# Patient Record
Sex: Female | Born: 1950
Health system: Southern US, Community
[De-identification: ages and names within clinical notes are randomized; demographics above are authoritative.]

## PROBLEM LIST (undated history)

## (undated) DIAGNOSIS — I10 Essential (primary) hypertension: Secondary | ICD-10-CM

## (undated) DIAGNOSIS — R87619 Unspecified abnormal cytological findings in specimens from cervix uteri: Secondary | ICD-10-CM

## (undated) DIAGNOSIS — K219 Gastro-esophageal reflux disease without esophagitis: Secondary | ICD-10-CM

## (undated) DIAGNOSIS — N6009 Solitary cyst of unspecified breast: Secondary | ICD-10-CM

## (undated) HISTORY — DX: Unspecified abnormal cytological findings in specimens from cervix uteri: R87.619

## (undated) HISTORY — PX: ABDOMINAL HYSTERECTOMY: SHX81

## (undated) HISTORY — DX: Solitary cyst of unspecified breast: N60.09

## (undated) HISTORY — PX: BREAST BIOPSY: SHX20

## (undated) HISTORY — PX: OOPHORECTOMY: SHX86

## (undated) HISTORY — PX: GANGLION CYST EXCISION: SHX1691

---

## 2004-10-15 ENCOUNTER — Ambulatory Visit: Payer: Self-pay | Admitting: Internal Medicine

## 2004-12-23 ENCOUNTER — Ambulatory Visit: Payer: Self-pay

## 2005-01-11 ENCOUNTER — Ambulatory Visit: Payer: Self-pay | Admitting: Unknown Physician Specialty

## 2006-01-04 ENCOUNTER — Ambulatory Visit: Payer: Self-pay

## 2007-01-12 ENCOUNTER — Ambulatory Visit: Payer: Self-pay

## 2008-01-22 ENCOUNTER — Ambulatory Visit: Payer: Self-pay

## 2009-01-22 ENCOUNTER — Ambulatory Visit: Payer: Self-pay

## 2009-02-02 ENCOUNTER — Ambulatory Visit: Payer: Self-pay

## 2009-07-22 ENCOUNTER — Ambulatory Visit: Payer: Self-pay

## 2010-01-28 ENCOUNTER — Ambulatory Visit: Payer: Self-pay

## 2010-03-01 ENCOUNTER — Ambulatory Visit: Payer: Self-pay | Admitting: Unknown Physician Specialty

## 2010-03-03 LAB — PATHOLOGY REPORT

## 2011-01-31 ENCOUNTER — Ambulatory Visit: Payer: Self-pay

## 2012-02-01 ENCOUNTER — Ambulatory Visit: Payer: Self-pay

## 2012-03-07 ENCOUNTER — Emergency Department: Payer: Self-pay

## 2012-03-07 LAB — TROPONIN I
Troponin-I: <0.02 ng/mL
Troponin-I: <0.02 ng/mL

## 2012-03-07 LAB — BASIC METABOLIC PANEL
Anion Gap: 9 (ref 7–16)
Chloride: 105 mmol/L (ref 98–107)
Co2: 26 mmol/L (ref 21–32)
Creatinine: 0.99 mg/dL (ref 0.60–1.30)
EGFR (African American): >60
EGFR (Non-African Amer.): >60
Glucose: 100 mg/dL — ABNORMAL HIGH (ref 65–99)
Osmolality: 282 (ref 275–301)
Potassium: 3.5 mmol/L (ref 3.5–5.1)
Sodium: 140 mmol/L (ref 136–145)

## 2012-03-07 LAB — CK TOTAL AND CKMB (NOT AT ARMC): CK, Total: 79 U/L (ref 21–215)

## 2012-03-07 LAB — CBC
HCT: 41.8 % (ref 35.0–47.0)
MCHC: 33.8 g/dL (ref 32.0–36.0)
MCV: 86 fL (ref 80–100)
RDW: 13.6 % (ref 11.5–14.5)
WBC: 10.5 10*3/uL (ref 3.6–11.0)

## 2013-02-06 ENCOUNTER — Ambulatory Visit: Payer: Self-pay | Admitting: General Practice

## 2014-02-11 ENCOUNTER — Ambulatory Visit: Payer: Self-pay

## 2014-07-31 ENCOUNTER — Encounter: Payer: Self-pay | Admitting: *Deleted

## 2014-07-31 ENCOUNTER — Ambulatory Visit: Payer: 59 | Admitting: Anesthesiology

## 2014-07-31 ENCOUNTER — Encounter
Admission: RE | Disposition: A | Discharge status: Home / Self Care | Payer: Self-pay | Source: Ambulatory Visit | Attending: Unknown Physician Specialty

## 2014-07-31 ENCOUNTER — Ambulatory Visit
Admission: RE | Admit: 2014-07-31 | Discharge: 2014-07-31 | Disposition: A | Discharge status: Home / Self Care | Payer: 59 | Source: Ambulatory Visit | Attending: Unknown Physician Specialty | Admitting: Unknown Physician Specialty

## 2014-07-31 DIAGNOSIS — I10 Essential (primary) hypertension: Secondary | ICD-10-CM | POA: Insufficient documentation

## 2014-07-31 DIAGNOSIS — K5730 Diverticulosis of large intestine without perforation or abscess without bleeding: Secondary | ICD-10-CM | POA: Insufficient documentation

## 2014-07-31 DIAGNOSIS — D123 Benign neoplasm of transverse colon: Secondary | ICD-10-CM | POA: Insufficient documentation

## 2014-07-31 DIAGNOSIS — Z79899 Other long term (current) drug therapy: Secondary | ICD-10-CM | POA: Diagnosis not present

## 2014-07-31 DIAGNOSIS — Z86010 Personal history of colonic polyps: Secondary | ICD-10-CM | POA: Insufficient documentation

## 2014-07-31 DIAGNOSIS — K219 Gastro-esophageal reflux disease without esophagitis: Secondary | ICD-10-CM | POA: Diagnosis not present

## 2014-07-31 DIAGNOSIS — K449 Diaphragmatic hernia without obstruction or gangrene: Secondary | ICD-10-CM | POA: Insufficient documentation

## 2014-07-31 DIAGNOSIS — K2950 Unspecified chronic gastritis without bleeding: Secondary | ICD-10-CM | POA: Diagnosis not present

## 2014-07-31 DIAGNOSIS — K621 Rectal polyp: Secondary | ICD-10-CM | POA: Insufficient documentation

## 2014-07-31 DIAGNOSIS — Z1211 Encounter for screening for malignant neoplasm of colon: Secondary | ICD-10-CM | POA: Diagnosis not present

## 2014-07-31 DIAGNOSIS — K648 Other hemorrhoids: Secondary | ICD-10-CM | POA: Diagnosis not present

## 2014-07-31 DIAGNOSIS — R12 Heartburn: Secondary | ICD-10-CM | POA: Diagnosis present

## 2014-07-31 HISTORY — DX: Essential (primary) hypertension: I10

## 2014-07-31 HISTORY — DX: Gastro-esophageal reflux disease without esophagitis: K21.9

## 2014-07-31 HISTORY — PX: ESOPHAGOGASTRODUODENOSCOPY: SHX5428

## 2014-07-31 HISTORY — PX: COLONOSCOPY WITH PROPOFOL: SHX5780

## 2014-07-31 SURGERY — COLONOSCOPY WITH PROPOFOL
Anesthesia: General

## 2014-07-31 MED ORDER — PROPOFOL 10 MG/ML IV BOLUS
INTRAVENOUS | Status: DC | PRN
  Administered 2014-07-31: 75 mg via INTRAVENOUS

## 2014-07-31 MED ORDER — PROPOFOL INFUSION 10 MG/ML OPTIME
INTRAVENOUS | Status: DC | PRN
  Administered 2014-07-31: 75 ug/kg/min via INTRAVENOUS

## 2014-07-31 MED ORDER — PHENYLEPHRINE HCL 10 MG/ML IJ SOLN
INTRAMUSCULAR | Status: DC | PRN
  Administered 2014-07-31 (×2): 50 ug via INTRAVENOUS

## 2014-07-31 MED ORDER — SODIUM CHLORIDE 0.9 % IV BOLUS (SEPSIS)
1000.0000 mL | Freq: Once | INTRAVENOUS | Status: AC
  Administered 2014-07-31: 1000 mL via INTRAVENOUS

## 2014-07-31 MED ORDER — SODIUM CHLORIDE 0.9 % IJ SOLN: 1000.0000 mL | Freq: Once | INTRAMUSCULAR | Status: DC

## 2014-07-31 MED ORDER — LACTATED RINGERS IV SOLN
INTRAVENOUS | Status: DC | PRN
  Administered 2014-07-31: 13:00:00 via INTRAVENOUS

## 2014-07-31 NOTE — Anesthesia Postprocedure Evaluation (Signed)
  Anesthesia Post-op Note  Patient: Emily Olson  Procedure(s) Performed: Procedure(s): COLONOSCOPY WITH PROPOFOL (N/A) ESOPHAGOGASTRODUODENOSCOPY (EGD) (N/A)  Anesthesia type:General  Patient location: PACU  Post pain: Pain level controlled  Post assessment: Post-op Vital signs reviewed, Patient's Cardiovascular Status Stable, Respiratory Function Stable, Patent Airway and No signs of Nausea or vomiting  Post vital signs: Reviewed and stable  Last Vitals:  Filed Vitals:   07/31/14 1400  BP: 140/76  Pulse: 64  Temp:   Resp: 13    Level of consciousness: awake, alert  and patient cooperative  Complications: No apparent anesthesia complications

## 2014-07-31 NOTE — Anesthesia Preprocedure Evaluation (Signed)
Anesthesia Evaluation  Patient identified by MRN, date of birth, ID band Patient awake    Reviewed: Allergy & Precautions, NPO status , Patient's Chart, lab work & pertinent test results  Airway Mallampati: II  TM Distance: >3 FB     Dental  (+) Chipped   Pulmonary          Cardiovascular hypertension, Pt. on medications     Neuro/Psych    GI/Hepatic   Endo/Other    Renal/GU      Musculoskeletal   Abdominal   Peds  Hematology   Anesthesia Other Findings   Reproductive/Obstetrics                             Anesthesia Physical Anesthesia Plan  ASA: II  Anesthesia Plan: General   Post-op Pain Management:    Induction: Intravenous  Airway Management Planned: Nasal Cannula  Additional Equipment:   Intra-op Plan:   Post-operative Plan:   Informed Consent: I have reviewed the patients History and Physical, chart, labs and discussed the procedure including the risks, benefits and alternatives for the proposed anesthesia with the patient or authorized representative who has indicated his/her understanding and acceptance.     Plan Discussed with: CRNA  Anesthesia Plan Comments:         Anesthesia Quick Evaluation

## 2014-07-31 NOTE — Transfer of Care (Signed)
Immediate Anesthesia Transfer of Care Note  Patient: Emily Olson  Procedure(s) Performed: Procedure(s): COLONOSCOPY WITH PROPOFOL (N/A) ESOPHAGOGASTRODUODENOSCOPY (EGD) (N/A)  Patient Location: PACU and Endoscopy Unit  Anesthesia Type:General  Level of Consciousness: awake, alert  and oriented  Airway & Oxygen Therapy: Patient Spontanous Breathing and Patient connected to nasal cannula oxygen  Post-op Assessment: Report given to RN and Post -op Vital signs reviewed and stable  Post vital signs: Reviewed and stable  Last Vitals:  Filed Vitals:   07/31/14 1104  BP: 144/67  Pulse: 63  Temp: 35.2 C  Resp: 16    Complications: No apparent anesthesia complications

## 2014-07-31 NOTE — Op Note (Signed)
[facility] Gastroenterology Patient Name: Emily Olson Procedure Date: 07/31/2014 12:44 PM MRN: [id number] Account #: [id number] Date of Birth: December 22, 1950 Admit Type: Inpatient Age: 64 Room: [facility] ENDO ROOM 1 Gender: Female Note Status: Finalized Procedure:         Colonoscopy Indications:       Personal history of colonic polyps Providers:         Manya Silvas, MD Medicines:         Propofol per Anesthesia Complications:     No immediate complications. Procedure:         Pre-Anesthesia Assessment:                    - After reviewing the risks and benefits, the patient was                     deemed in satisfactory condition to undergo the procedure.                    After obtaining informed consent, the colonoscope was                     passed under direct vision. Throughout the procedure, the                     patient's blood pressure, pulse, and oxygen saturations                     were monitored continuously. The Colonoscope was                     introduced through the anus and advanced to the the cecum,                     identified by appendiceal orifice and ileocecal valve. The                     colonoscopy was performed without difficulty. The patient                     tolerated the procedure well. The quality of the bowel                     preparation was good. Findings:      Two sessile polyps were found in the rectum and at the hepatic flexure.       The polyps were diminutive in size. These polyps were removed with a       jumbo cold forceps. Resection and retrieval were complete.      A few medium-mouthed diverticula were found in the sigmoid colon.      Internal hemorrhoids were found during endoscopy. The hemorrhoids were       small and Grade I (internal hemorrhoids that do not prolapse). Impression:        - Two diminutive polyps in the rectum and at the hepatic                     flexure. Resected and retrieved.              - Diverticulosis in the sigmoid colon.                    - Internal hemorrhoids. Recommendation:    - Await pathology results. Manya Silvas, MD 07/31/2014 1:26:08 PM  This report has been signed electronically. Number of Addenda: 0 Note Initiated On: 07/31/2014 12:44 PM Scope Withdrawal Time: 0 hours 9 minutes 6 seconds  Total Procedure Duration: 0 hours 18 minutes 28 seconds       [facility]

## 2014-07-31 NOTE — Op Note (Signed)
[facility] Gastroenterology Patient Name: Emily Olson Procedure Date: 07/31/2014 12:43 PM MRN: [id number] Account #: [id number] Date of Birth: 02-18-1950 Admit Type: Outpatient Age: 64 Room: [facility] ENDO ROOM 1 Gender: Female Note Status: Finalized Procedure:         Upper GI endoscopy Indications:       Heartburn, Suspected gastro-esophageal reflux disease Providers:         Manya Silvas, MD Referring MD:      Mechele Collin. Anderson (Referring MD) Medicines:         Propofol per Anesthesia Complications:     No immediate complications. Procedure:         Pre-Anesthesia Assessment:                    - After reviewing the risks and benefits, the patient was                     deemed in satisfactory condition to undergo the procedure.                    After obtaining informed consent, the endoscope was passed                     under direct vision. Throughout the procedure, the                     patient's blood pressure, pulse, and oxygen saturations                     were monitored continuously. The Olympus GIF-160 endoscope                     (S#. [id number]) was introduced through the mouth, and                     advanced to the second part of duodenum. The upper GI                     endoscopy was accomplished without difficulty. The patient                     tolerated the procedure well. Findings:      The examined esophagus was normal. GEJ 39-40cm      A small hiatus hernia was present.      Diffuse mild inflammation characterized by erythema and granularity was       found in the gastric body. Biopsies were taken with a cold forceps for       histology. Biopsies were taken with a cold forceps for Helicobacter       pylori testing.      The examined duodenum was normal. Impression:        - Normal esophagus.                    - Small hiatus hernia.                    - Gastritis. Biopsied.                    - Normal examined  duodenum. Recommendation:    - Await pathology results. Manya Silvas, MD 07/31/2014 1:04:11 PM This report has been signed electronically. Number of Addenda: 0 Note Initiated On: 07/31/2014 12:43 PM      [place]  [facility]

## 2014-07-31 NOTE — H&P (Signed)
Primary Care Physician:  Kirk Ruths., MD Primary Gastroenterologist:  Dr. Vira Agar  Pre-Procedure History & Physical: HPI:  Emily Olson is a 64 y.o. female is here for an endoscopy and colonoscopy.   Past Medical History  Diagnosis Date  . Hypertension   . GERD (gastroesophageal reflux disease)     Past Surgical History  Procedure Laterality Date  . Ganglion cyst excision Right     Prior to Admission medications   Medication Sig Start Date End Date Taking? Authorizing Provider  amLODipine (NORVASC) 10 MG tablet Take 10 mg by mouth daily.   Yes Historical Provider, MD  hydrochlorothiazide (HYDRODIURIL) 25 MG tablet Take 25 mg by mouth daily.   Yes Historical Provider, MD  pantoprazole (PROTONIX) 40 MG tablet Take 40 mg by mouth daily.   Yes Historical Provider, MD  potassium chloride (K-DUR) 10 MEQ tablet Take 10 mEq by mouth daily.   Yes Historical Provider, MD    Allergies as of 07/29/2014  . (Not on File)    History reviewed. No pertinent family history.  History   Social History  . Marital Status: Married    Spouse Name: N/A  . Number of Children: N/A  . Years of Education: N/A   Occupational History  . Not on file.   Social History Main Topics  . Smoking status: Never Smoker   . Smokeless tobacco: Never Used  . Alcohol Use: No  . Drug Use: No  . Sexual Activity: Not on file   Other Topics Concern  . Not on file   Social History Narrative    Review of Systems: See HPI, otherwise negative ROS  Physical Exam: BP 144/67 mmHg  Pulse 63  Temp(Src) 95.3 F (35.2 C) (Tympanic)  Resp 16  Ht 5\' 8"  (1.727 m)  Wt 101.606 kg (224 lb)  BMI 34.07 kg/m2  SpO2 100% General:   Alert,  pleasant and cooperative in NAD Head:  Normocephalic and atraumatic. Neck:  Supple; no masses or thyromegaly. Lungs:  Clear throughout to auscultation.    Heart:  Regular rate and rhythm. Abdomen:  Soft, nontender and nondistended. Normal bowel sounds,  without guarding, and without rebound.   Neurologic:  Alert and  oriented x4;  grossly normal neurologically. endoscopy and colonoscopy Impression/Plan: Emily Olson is here for an  to be performed for personal history of colon polyps and epigastric abdominal pain  Risks, benefits, limitations, and alternatives regarding  endoscopy and colonoscopy have been reviewed with the patient.  Questions have been answered.  All parties agreeable.   Gaylyn Cheers, MD  07/31/2014, 12:49 PM   Primary Care Physician:  Kirk Ruths., MD Primary Gastroenterologist:  Dr. Vira Agar  Pre-Procedure History & Physical: HPI:  Emily Olson is a 64 y.o. female is here for an endoscopy and colonoscopy.   Past Medical History  Diagnosis Date  . Hypertension   . GERD (gastroesophageal reflux disease)     Past Surgical History  Procedure Laterality Date  . Ganglion cyst excision Right     Prior to Admission medications   Medication Sig Start Date End Date Taking? Authorizing Provider  amLODipine (NORVASC) 10 MG tablet Take 10 mg by mouth daily.   Yes Historical Provider, MD  hydrochlorothiazide (HYDRODIURIL) 25 MG tablet Take 25 mg by mouth daily.   Yes Historical Provider, MD  pantoprazole (PROTONIX) 40 MG tablet Take 40 mg by mouth daily.   Yes Historical Provider, MD  potassium chloride (K-DUR) 10 MEQ tablet Take 10 mEq  by mouth daily.   Yes Historical Provider, MD    Allergies as of 07/29/2014  . (Not on File)    History reviewed. No pertinent family history.  History   Social History  . Marital Status: Married    Spouse Name: N/A  . Number of Children: N/A  . Years of Education: N/A   Occupational History  . Not on file.   Social History Main Topics  . Smoking status: Never Smoker   . Smokeless tobacco: Never Used  . Alcohol Use: No  . Drug Use: No  . Sexual Activity: Not on file   Other Topics Concern  . Not on file   Social History Narrative    Review  of Systems: See HPI, otherwise negative ROS  Physical Exam: BP 144/67 mmHg  Pulse 63  Temp(Src) 95.3 F (35.2 C) (Tympanic)  Resp 16  Ht 5\' 8"  (1.727 m)  Wt 101.606 kg (224 lb)  BMI 34.07 kg/m2  SpO2 100% General:   Alert,  pleasant and cooperative in NAD Head:  Normocephalic and atraumatic. Neck:  Supple; no masses or thyromegaly. Lungs:  Clear throughout to auscultation.    Heart:  Regular rate and rhythm. Abdomen:  Soft, nontender and nondistended. Normal bowel sounds, without guarding, and without rebound.   Neurologic:  Alert and  oriented x4;  grossly normal neurologically.  Impression/Plan: Emily Olson is here for an endoscopy and colonoscopy to be performed for personal history of colon polyps and epigastric abdominal pain.  Risks, benefits, limitations, and alternatives regarding  endoscopy and colonoscopy have been reviewed with the patient.  Questions have been answered.  All parties agreeable.   Gaylyn Cheers, MD  07/31/2014, 12:49 PM

## 2014-08-04 LAB — SURGICAL PATHOLOGY

## 2014-08-11 ENCOUNTER — Encounter: Payer: Self-pay | Admitting: Unknown Physician Specialty

## 2014-09-05 IMAGING — MG MM CAD SCREENING MAMMO
1 series · 4 of 4 positions shown · non-contrast
Comparison: none

REASON FOR EXAM: SCR MAMMO CAT 2 NO ORDER
COMMENTS:

[R CC · right · 4 of 4 slices shown]
[im 1/4]
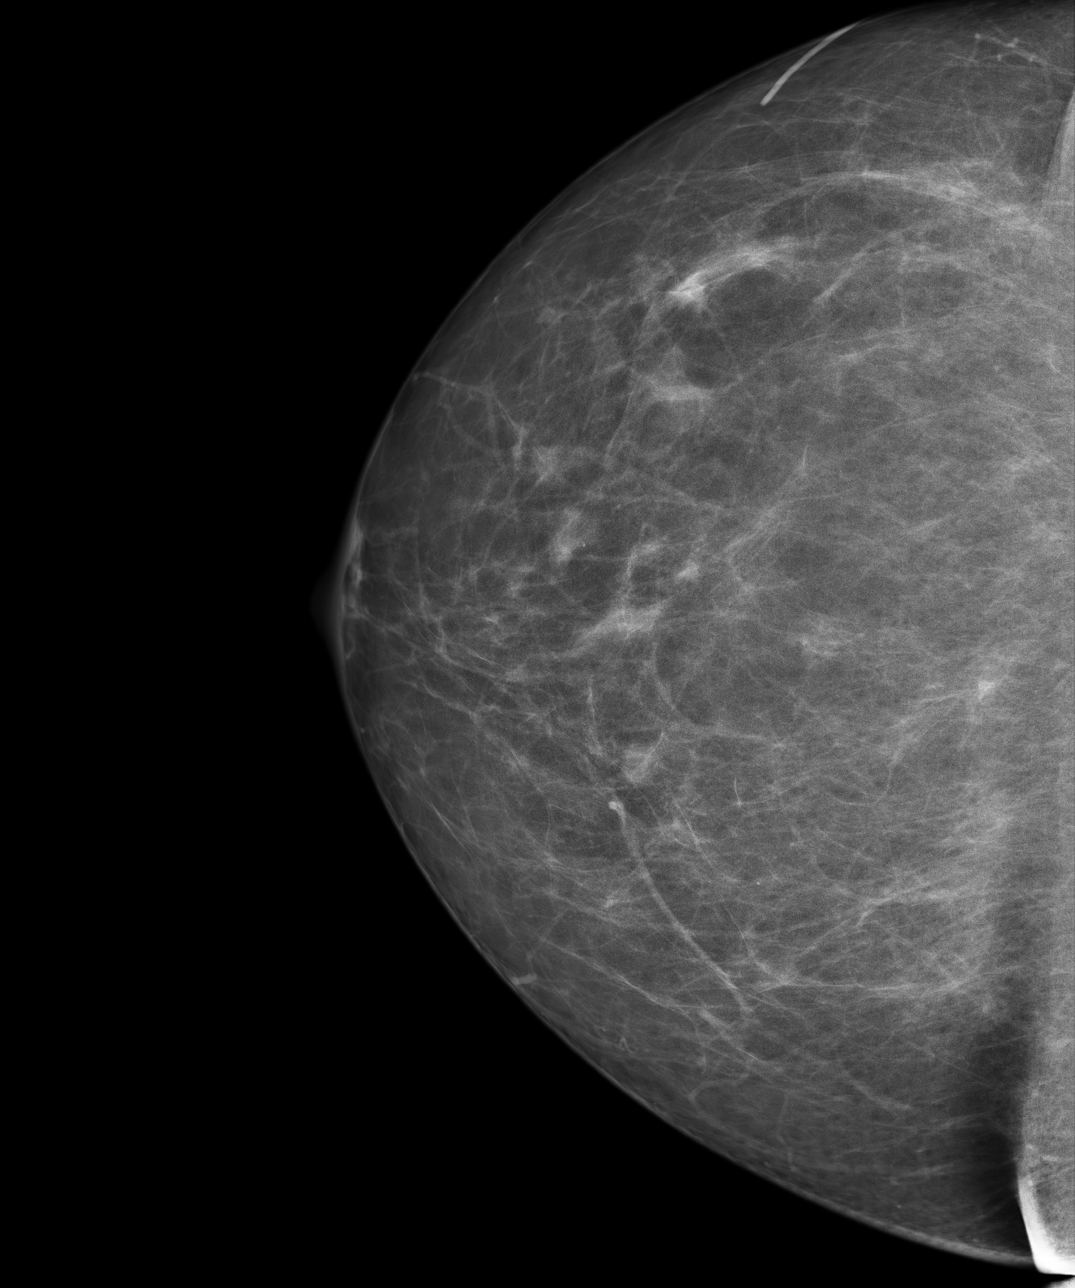
[im 2/4]
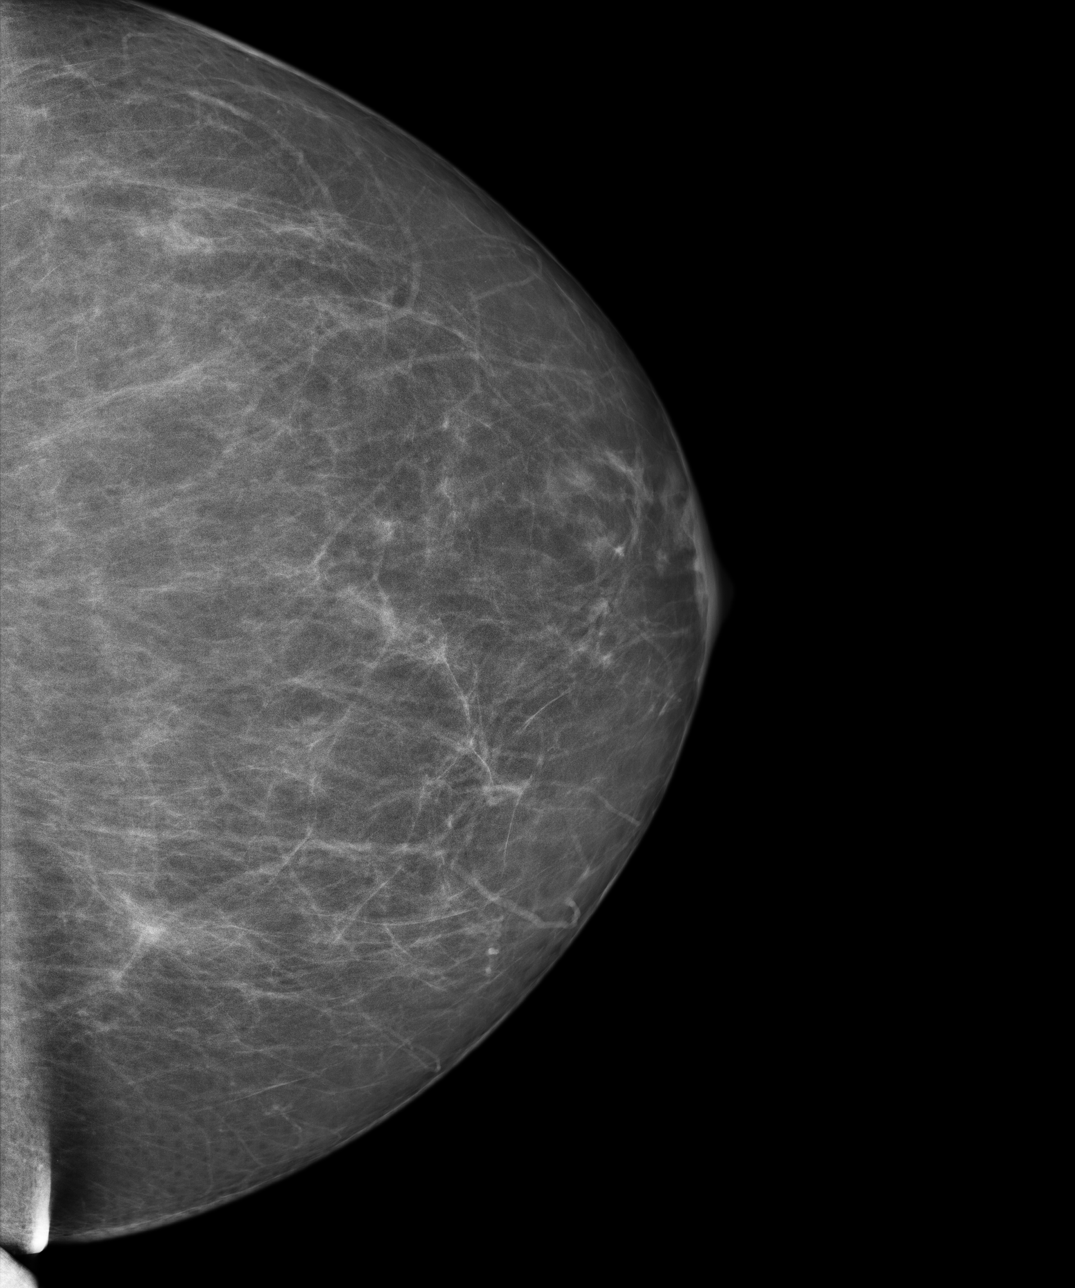
[im 3/4]
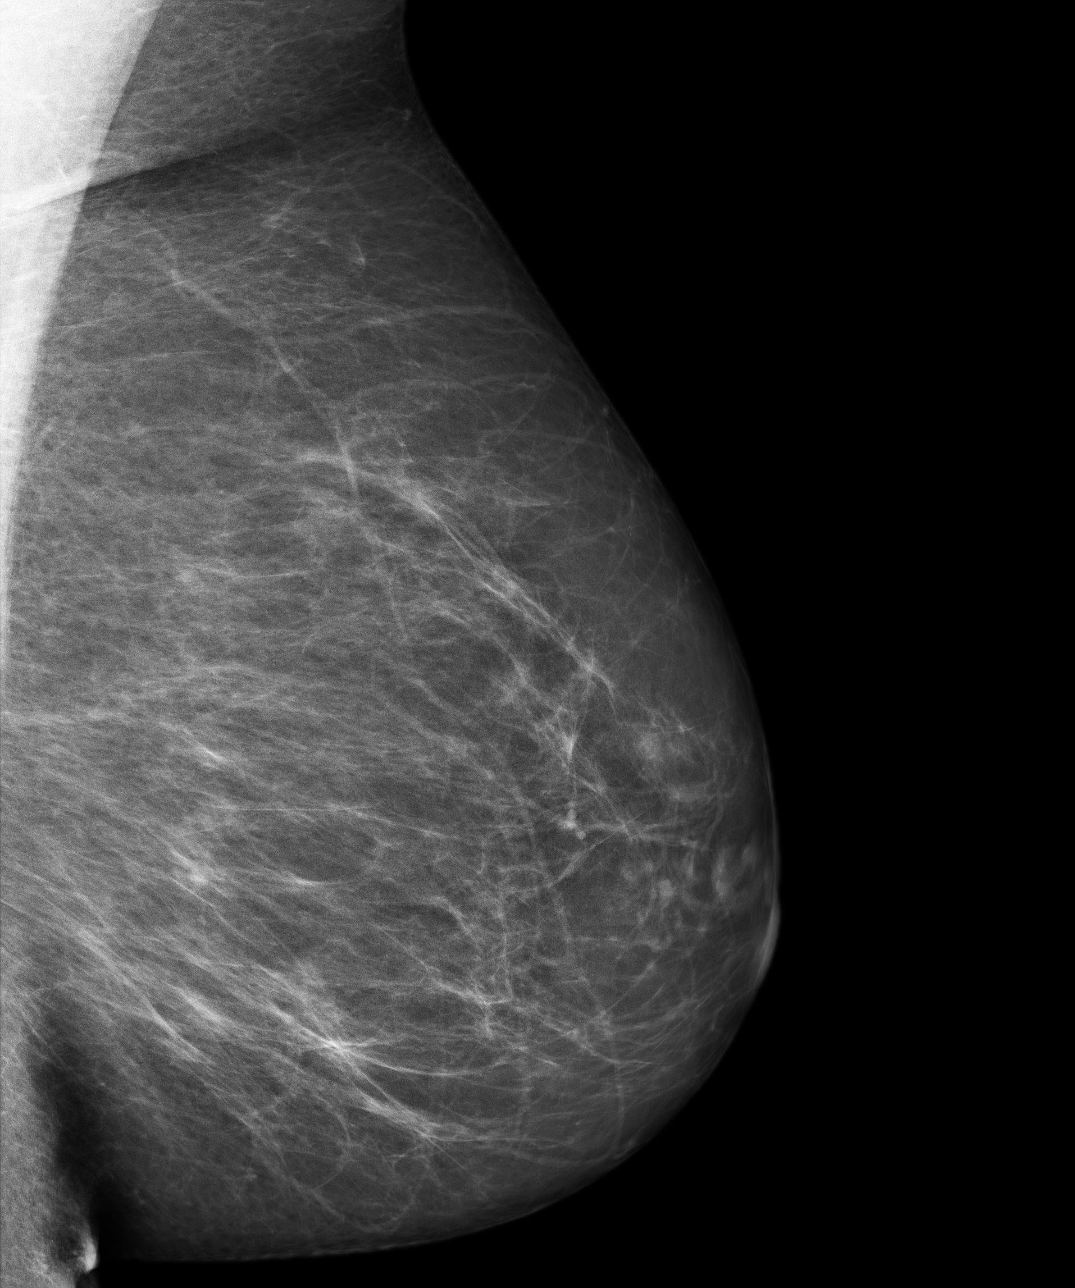
[im 4/4]
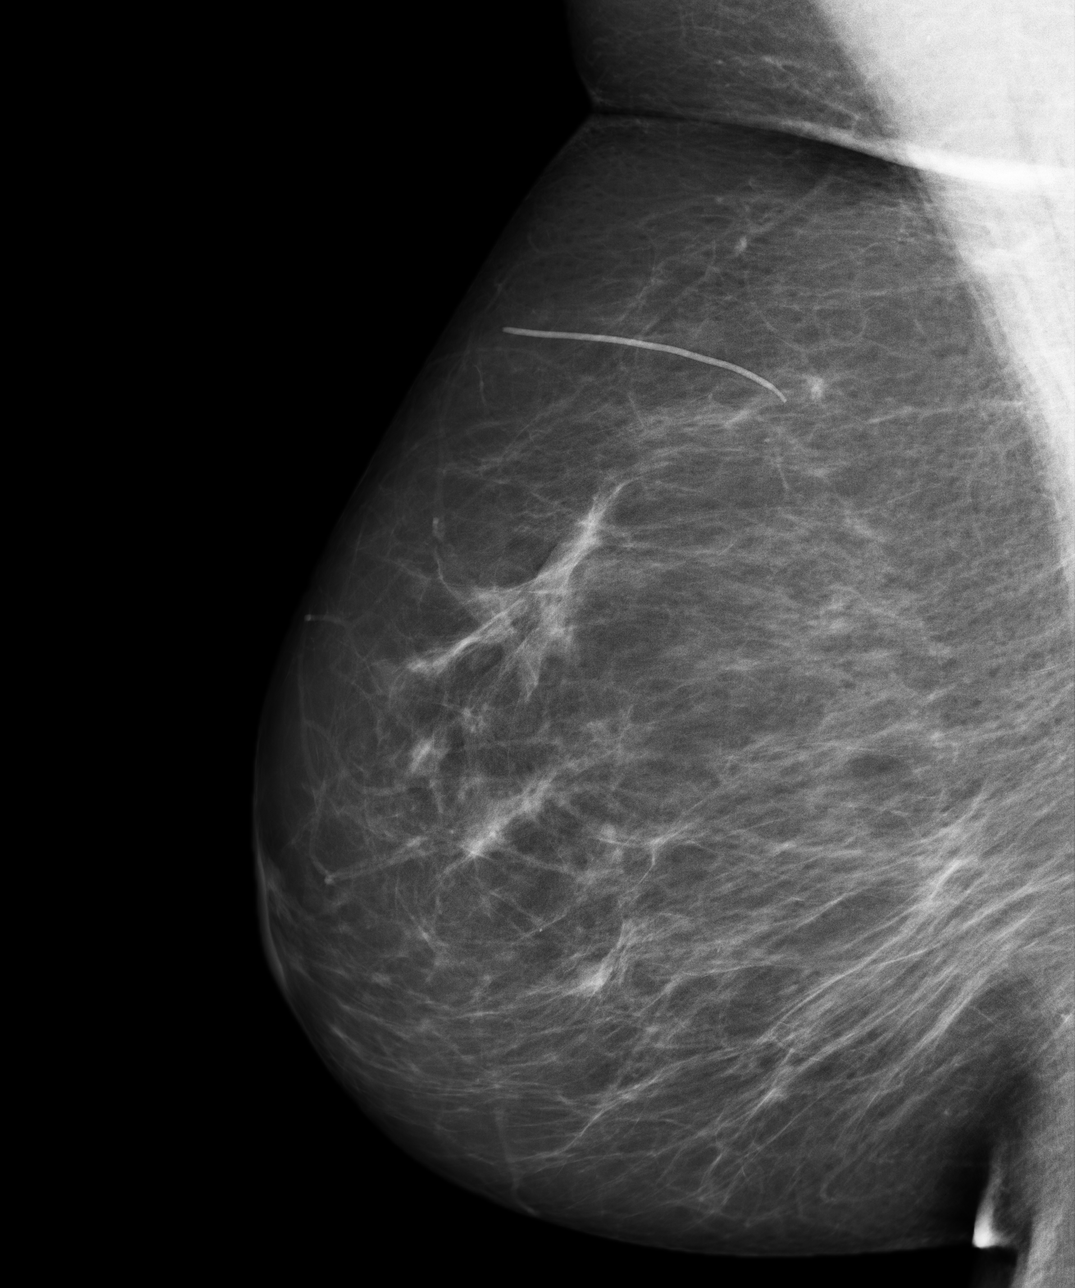

[4 of 4 positions shown; findings below may reference images not displayed]

PROCEDURE:     MAM - MAM DGTL SCRN MAM NO ORDER W/CAD  - February 01, 2012  [DATE]

RESULT:     Comparison is made to previous digital studies dated [DATE]
[DATE], [DATE], [DATE] [DATE], [DATE], [DATE] [DATE], [DATE], and January 04, 2006.

The breasts exhibit a scattered moderately dense parenchymal pattern. A
surgical scar marker has been placed superolaterally on the right. There are
no malignant appearing groupings of microcalcification. There is no evidence
of new architectural distortion.
IMPRESSION: There are no findings suspicious for malignancy.

BI-RADS 2: Benign findings.

Recommendation: Please continue to encourage yearly mammographic followup.

A NEGATIVE MAMMOGRAM REPORT DOES NOT PRECLUDE BIOPSY OR OTHER EVALUATION OF
A CLINICALLY PALPABLE OR OTHERWISE SUSPICIOUS MASS OR LESION. BREAST CANCER
MAY NOT BE DETECTED BY MAMMOGRAPHY IN UP TO 10% OF CASES.

[REDACTED]

## 2015-01-13 ENCOUNTER — Other Ambulatory Visit: Payer: Self-pay | Admitting: Unknown Physician Specialty

## 2015-01-13 DIAGNOSIS — Z1231 Encounter for screening mammogram for malignant neoplasm of breast: Secondary | ICD-10-CM

## 2015-01-31 LAB — HM PAP SMEAR: HM PAP: NEGATIVE

## 2015-02-13 ENCOUNTER — Ambulatory Visit
Admission: RE | Admit: 2015-02-13 | Discharge: 2015-02-13 | Disposition: A | Discharge status: Home / Self Care | Payer: No Typology Code available for payment source | Source: Ambulatory Visit | Attending: Unknown Physician Specialty | Admitting: Unknown Physician Specialty

## 2015-02-13 ENCOUNTER — Other Ambulatory Visit: Payer: Self-pay | Admitting: Unknown Physician Specialty

## 2015-02-13 DIAGNOSIS — Z1231 Encounter for screening mammogram for malignant neoplasm of breast: Secondary | ICD-10-CM

## 2016-02-01 ENCOUNTER — Other Ambulatory Visit: Payer: Self-pay | Admitting: Obstetrics & Gynecology

## 2016-02-01 DIAGNOSIS — Z1231 Encounter for screening mammogram for malignant neoplasm of breast: Secondary | ICD-10-CM

## 2016-02-18 ENCOUNTER — Ambulatory Visit
Admission: RE | Admit: 2016-02-18 | Discharge: 2016-02-18 | Disposition: A | Discharge status: Home / Self Care | Payer: BLUE CROSS/BLUE SHIELD | Source: Ambulatory Visit | Attending: Obstetrics & Gynecology | Admitting: Obstetrics & Gynecology

## 2016-02-18 DIAGNOSIS — Z1231 Encounter for screening mammogram for malignant neoplasm of breast: Secondary | ICD-10-CM | POA: Diagnosis present

## 2016-02-18 LAB — HM MAMMOGRAPHY

## 2016-11-22 ENCOUNTER — Other Ambulatory Visit: Payer: Self-pay | Admitting: Obstetrics & Gynecology

## 2016-11-22 DIAGNOSIS — Z1231 Encounter for screening mammogram for malignant neoplasm of breast: Secondary | ICD-10-CM

## 2017-01-26 ENCOUNTER — Encounter: Payer: Self-pay | Admitting: Obstetrics & Gynecology

## 2017-02-02 ENCOUNTER — Encounter: Payer: Self-pay | Admitting: Obstetrics & Gynecology

## 2017-02-02 ENCOUNTER — Telehealth: Payer: Self-pay | Admitting: Obstetrics & Gynecology

## 2017-02-02 ENCOUNTER — Ambulatory Visit (INDEPENDENT_AMBULATORY_CARE_PROVIDER_SITE_OTHER): Payer: Medicare Other | Admitting: Obstetrics & Gynecology

## 2017-02-02 VITALS — BP 120/80 | HR 64 | Ht 68.5 in | Wt 235.0 lb

## 2017-02-02 DIAGNOSIS — Z13820 Encounter for screening for osteoporosis: Secondary | ICD-10-CM

## 2017-02-02 DIAGNOSIS — Z1231 Encounter for screening mammogram for malignant neoplasm of breast: Secondary | ICD-10-CM | POA: Diagnosis not present

## 2017-02-02 DIAGNOSIS — Z1211 Encounter for screening for malignant neoplasm of colon: Secondary | ICD-10-CM

## 2017-02-02 DIAGNOSIS — Z1239 Encounter for other screening for malignant neoplasm of breast: Secondary | ICD-10-CM

## 2017-02-02 DIAGNOSIS — Z01419 Encounter for gynecological examination (general) (routine) without abnormal findings: Secondary | ICD-10-CM | POA: Diagnosis not present

## 2017-02-02 DIAGNOSIS — Z0000 Encounter for general adult medical examination without abnormal findings: Secondary | ICD-10-CM

## 2017-02-02 NOTE — Progress Notes (Signed)
HPI:      Emily Olson is a 66 y.o. G2P1011 who LMP was in the past, she presents today for her annual examination.  The patient has no complaints today. The patient is sexually active. Herlast pap: approximate date 2016 and was normal and last mammogram: approximate date 2017 and was normal.  The patient does perform self breast exams.  There is no notable family history of breast or ovarian cancer in her family. The patient is not taking hormone replacement therapy. Patient denies post-menopausal vaginal bleeding.   The patient has regular exercise: yes. The patient denies current symptoms of depression.    GYN Hx: Last Colonoscopy:2 years ago. Normal.  Last DEXA: several years ago.    PMHx: Past Medical History:  Diagnosis Date  . Abnormal Pap smear of cervix   . GERD (gastroesophageal reflux disease)   . Hypertension   . Solitary cyst of breast    Past Surgical History:  Procedure Laterality Date  . ABDOMINAL HYSTERECTOMY    . BREAST BIOPSY Right    neg  . COLONOSCOPY WITH PROPOFOL N/A 07/31/2014   Procedure: COLONOSCOPY WITH PROPOFOL;  Surgeon: Manya Silvas, MD;  Location: [facility] ENDOSCOPY;  Service: Endoscopy;  Laterality: N/A;  . ESOPHAGOGASTRODUODENOSCOPY N/A 07/31/2014   Procedure: ESOPHAGOGASTRODUODENOSCOPY (EGD);  Surgeon: Manya Silvas, MD;  Location: [facility] ENDOSCOPY;  Service: Endoscopy;  Laterality: N/A;  . GANGLION CYST EXCISION Right    Family History  Problem Relation Age of Onset  . Breast cancer Cousin 108        2nd time age 65-mat cousin  . Hypertension Mother   . Kidney failure Mother   . Diabetes Father   . Hypertension Father   . Heart attack Father    Social History   Tobacco Use  . Smoking status: Never Smoker  . Smokeless tobacco: Never Used  Substance Use Topics  . Alcohol use: No  . Drug use: No    Current Outpatient Medications:  .  amLODipine (NORVASC) 10 MG tablet, Take 10 mg by mouth daily., Disp: , Rfl:  .   hydrochlorothiazide (HYDRODIURIL) 25 MG tablet, Take 25 mg by mouth daily., Disp: , Rfl:  .  pantoprazole (PROTONIX) 40 MG tablet, Take 40 mg by mouth daily., Disp: , Rfl:  .  potassium chloride (K-DUR) 10 MEQ tablet, Take 10 mEq by mouth daily., Disp: , Rfl:  Allergies: Codeine; Ketoconazole; and Penicillins  Review of Systems  Constitutional: Negative for chills, fever and malaise/fatigue.  HENT: Negative for congestion, sinus pain and sore throat.   Eyes: Negative for blurred vision and pain.  Respiratory: Negative for cough and wheezing.   Cardiovascular: Negative for chest pain and leg swelling.  Gastrointestinal: Negative for abdominal pain, constipation, diarrhea, heartburn, nausea and vomiting.  Genitourinary: Negative for dysuria, frequency, hematuria and urgency.  Musculoskeletal: Negative for back pain, joint pain, myalgias and neck pain.  Skin: Negative for itching and rash.  Neurological: Negative for dizziness, tremors and weakness.  Endo/Heme/Allergies: Does not bruise/bleed easily.  Psychiatric/Behavioral: Negative for depression. The patient is not nervous/anxious and does not have insomnia.     Objective: BP 120/80   Pulse 64   Ht 5' 8.5" (1.74 m)   Wt 235 lb (106.6 kg)   BMI 35.21 kg/m   Filed Weights   02/02/17 0804  Weight: 235 lb (106.6 kg)   Body mass index is 35.21 kg/m. Physical Exam  Constitutional: She is oriented to person, place, and time. She appears  well-developed and well-nourished. No distress.  Genitourinary: Rectum normal and vagina normal. Pelvic exam was performed with patient supine. There is no rash or lesion on the right labia. There is no rash or lesion on the left labia. Vagina exhibits no lesion. No bleeding in the vagina. Right adnexum does not display mass and does not display tenderness. Left adnexum does not display mass and does not display tenderness.  Genitourinary Comments: Absent Uterus Absent cervix Vaginal cuff well healed    HENT:  Head: Normocephalic and atraumatic. Head is without laceration.  Right Ear: Hearing normal.  Left Ear: Hearing normal.  Nose: No epistaxis.  No foreign bodies.  Mouth/Throat: Uvula is midline, oropharynx is clear and moist and mucous membranes are normal.  Eyes: Pupils are equal, round, and reactive to light.  Neck: Normal range of motion. Neck supple. No thyromegaly present.  Cardiovascular: Normal rate and regular rhythm. Exam reveals no gallop and no friction rub.  No murmur heard. Pulmonary/Chest: Effort normal and breath sounds normal. No respiratory distress. She has no wheezes. Right breast exhibits no mass, no skin change and no tenderness. Left breast exhibits no mass, no skin change and no tenderness.  Abdominal: Soft. Bowel sounds are normal. She exhibits no distension. There is no tenderness. There is no rebound.  Musculoskeletal: Normal range of motion.  Neurological: She is alert and oriented to person, place, and time. No cranial nerve deficit.  Skin: Skin is warm and dry.  Psychiatric: She has a normal mood and affect. Judgment normal.  Vitals reviewed.  Assessment: Annual Exam 1. Annual physical exam   2. Screen for colon cancer   3. Screening for breast cancer    Plan:            1.  Cervical Screening-  Pap smear schedule reviewed with patient  2. Breast screening- Exam annually and mammogram scheduled  3. Colonoscopy every 10 years, Hemoccult testing after age 35  4. Labs managed by PCP  5. Counseling for hormonal therapy: no change in therapy today (none needed)  6. DEXA for osteoporosis screen    F/U  Return in about 1 year (around 02/02/2018) for Annual.  Barnett Applebaum, MD, [facility], [place] [facility] 02/02/2017  8:12 AM

## 2017-02-02 NOTE — Patient Instructions (Addendum)
PAP every three years Mammogram every year    Call [phone] to schedule at [facility] Colonoscopy every 10 years Labs yearly (with PCP) Bone density scan this year

## 2017-02-02 NOTE — Telephone Encounter (Signed)
Patient is scheduled for a screening mammogram at [facility] on 02/20/17 @ 9:40pm. [facility] did not have availability to schedule the DEXA at the same time. Patient to contact [facility] directly (phone# given) to decide whether to move the mammo to schedule both together or to schedule the DEXA separately.

## 2017-02-12 LAB — FECAL OCCULT BLOOD, IMMUNOCHEMICAL: FECAL OCCULT BLD: NEGATIVE

## 2017-02-20 ENCOUNTER — Ambulatory Visit: Payer: BLUE CROSS/BLUE SHIELD

## 2017-02-27 ENCOUNTER — Other Ambulatory Visit: Payer: BLUE CROSS/BLUE SHIELD

## 2017-02-27 ENCOUNTER — Ambulatory Visit: Payer: BLUE CROSS/BLUE SHIELD

## 2017-02-28 ENCOUNTER — Ambulatory Visit
Admission: RE | Admit: 2017-02-28 | Discharge: 2017-02-28 | Disposition: A | Discharge status: Home / Self Care | Payer: Medicare Other | Source: Ambulatory Visit | Attending: Obstetrics & Gynecology | Admitting: Obstetrics & Gynecology

## 2017-02-28 DIAGNOSIS — Z13820 Encounter for screening for osteoporosis: Secondary | ICD-10-CM | POA: Diagnosis present

## 2017-02-28 DIAGNOSIS — Z1231 Encounter for screening mammogram for malignant neoplasm of breast: Secondary | ICD-10-CM | POA: Diagnosis present

## 2017-02-28 DIAGNOSIS — M8588 Other specified disorders of bone density and structure, other site: Secondary | ICD-10-CM | POA: Insufficient documentation

## 2017-02-28 DIAGNOSIS — Z1239 Encounter for other screening for malignant neoplasm of breast: Secondary | ICD-10-CM

## 2017-03-01 ENCOUNTER — Encounter: Payer: Self-pay | Admitting: Obstetrics & Gynecology

## 2017-03-06 ENCOUNTER — Telehealth: Payer: Self-pay

## 2017-03-06 NOTE — Telephone Encounter (Signed)
Pain in right breast since last Tuesday pain on the inside like a tooth ache, took Advil, nothing has helped.  Pt states no lumps, could it be bruised?  just aches comes and goes very sore. Pleaser advise

## 2017-03-06 NOTE — Telephone Encounter (Signed)
Pt calling to talk to [facility] regarding the pain she has been having since her mammogram. She has already spoken to the [place] and they advised she contact [place]. Cb# [phone] or [phone]

## 2017-03-07 NOTE — Telephone Encounter (Signed)
Likely soreness related to procedure.  Can schedule exam visit if persists or if lump. Reassurance is probably the best answer.

## 2017-03-07 NOTE — Telephone Encounter (Signed)
Pt aware Will let us know if its not any better by next week

## 2017-03-28 ENCOUNTER — Encounter: Payer: Self-pay | Admitting: General Surgery

## 2017-11-16 ENCOUNTER — Other Ambulatory Visit: Payer: Self-pay | Admitting: Obstetrics & Gynecology

## 2017-11-16 DIAGNOSIS — Z1231 Encounter for screening mammogram for malignant neoplasm of breast: Secondary | ICD-10-CM

## 2018-02-08 ENCOUNTER — Encounter: Payer: Self-pay | Admitting: Obstetrics & Gynecology

## 2018-02-08 ENCOUNTER — Ambulatory Visit (INDEPENDENT_AMBULATORY_CARE_PROVIDER_SITE_OTHER): Payer: Medicare Other | Admitting: Obstetrics & Gynecology

## 2018-02-08 VITALS — BP 120/80 | Ht 67.0 in | Wt 236.0 lb

## 2018-02-08 DIAGNOSIS — Z1211 Encounter for screening for malignant neoplasm of colon: Secondary | ICD-10-CM

## 2018-02-08 DIAGNOSIS — Z01419 Encounter for gynecological examination (general) (routine) without abnormal findings: Secondary | ICD-10-CM

## 2018-02-08 NOTE — Patient Instructions (Signed)
PAP every 5 years after hysterectomy Mammogram every year (scheduled 03/01/18) at [facility] Colonoscopy every 10 years Labs yearly (with PCP)

## 2018-02-08 NOTE — Progress Notes (Signed)
HPI:      [name] is a 67 y.o. G2P1011 who LMP was in the past (S/P HYSTERECTOMY), she presents today for her annual examination.  The patient has no complaints today. The patient is sexually active. Herlast pap: approximate date 2016 and was normal and last mammogram: approximate date 2019 and was normal.  The patient does perform self breast exams.  There is no notable family history of breast or ovarian cancer in her family. The patient is not taking hormone replacement therapy. Patient denies post-menopausal vaginal bleeding.   The patient has regular exercise: yes. The patient denies current symptoms of depression.    GYN Hx: Last Colonoscopy:3 years ago. Normal.  Last DEXA: year ago.    PMHx: Past Medical History:  Diagnosis Date  . Abnormal Pap smear of cervix   . GERD (gastroesophageal reflux disease)   . Hypertension   . Solitary cyst of breast    Past Surgical History:  Procedure Laterality Date  . ABDOMINAL HYSTERECTOMY    . BREAST BIOPSY Right    neg  . COLONOSCOPY WITH PROPOFOL N/A 07/31/2014   Procedure: COLONOSCOPY WITH PROPOFOL;  Surgeon: Manya Silvas, MD;  Location: [facility] ENDOSCOPY;  Service: Endoscopy;  Laterality: N/A;  . ESOPHAGOGASTRODUODENOSCOPY N/A 07/31/2014   Procedure: ESOPHAGOGASTRODUODENOSCOPY (EGD);  Surgeon: Manya Silvas, MD;  Location: [facility] ENDOSCOPY;  Service: Endoscopy;  Laterality: N/A;  . GANGLION CYST EXCISION Right   . OOPHORECTOMY     Family History  Problem Relation Age of Onset  . Breast cancer Cousin 32        2nd time age 4-mat cousin  . Hypertension Mother   . Kidney failure Mother   . Diabetes Father   . Hypertension Father   . Heart attack Father    Social History   Tobacco Use  . Smoking status: Never Smoker  . Smokeless tobacco: Never Used  Substance Use Topics  . Alcohol use: No  . Drug use: No    Current Outpatient Medications:  .  amLODipine (NORVASC) 10 MG tablet, Take 10 mg by mouth daily.,  Disp: , Rfl:  .  hydrochlorothiazide (HYDRODIURIL) 25 MG tablet, Take 25 mg by mouth daily., Disp: , Rfl:  .  potassium chloride (K-DUR) 10 MEQ tablet, Take 10 mEq by mouth daily., Disp: , Rfl:  .  ranitidine (ZANTAC) 300 MG tablet, Take by mouth., Disp: , Rfl:  .  pantoprazole (PROTONIX) 40 MG tablet, Take 40 mg by mouth daily., Disp: , Rfl:  Allergies: Codeine; Ketoconazole; and Penicillins  Review of Systems  Constitutional: Negative for chills, fever and malaise/fatigue.  HENT: Negative for congestion, sinus pain and sore throat.   Eyes: Negative for blurred vision and pain.  Respiratory: Negative for cough and wheezing.   Cardiovascular: Negative for chest pain and leg swelling.  Gastrointestinal: Negative for abdominal pain, constipation, diarrhea, heartburn, nausea and vomiting.  Genitourinary: Negative for dysuria, frequency, hematuria and urgency.  Musculoskeletal: Negative for back pain, joint pain, myalgias and neck pain.  Skin: Negative for itching and rash.  Neurological: Negative for dizziness, tremors and weakness.  Endo/Heme/Allergies: Does not bruise/bleed easily.  Psychiatric/Behavioral: Negative for depression. The patient is not nervous/anxious and does not have insomnia.     Objective: BP 120/80   Ht 5\' 7"  (1.702 m)   Wt 236 lb (107 kg)   BMI 36.96 kg/m   Filed Weights   02/08/18 0843  Weight: 236 lb (107 kg)   Body mass index is  36.96 kg/m. Physical Exam Constitutional:      General: She is not in acute distress.    Appearance: She is well-developed.  Genitourinary:     Pelvic exam was performed with patient supine.     Vagina and rectum normal.     No lesions in the vagina.     No vaginal bleeding.     No right or left adnexal mass present.     Right adnexa not tender.     Left adnexa not tender.     Genitourinary Comments: Absent Uterus Absent cervix Vaginal cuff well healed  HENT:     Head: Normocephalic and atraumatic. No laceration.      Right Ear: Hearing normal.     Left Ear: Hearing normal.     Mouth/Throat:     Pharynx: Uvula midline.  Eyes:     Pupils: Pupils are equal, round, and reactive to light.  Neck:     Musculoskeletal: Normal range of motion and neck supple.     Thyroid: No thyromegaly.  Cardiovascular:     Rate and Rhythm: Normal rate and regular rhythm.     Heart sounds: No murmur. No friction rub. No gallop.   Pulmonary:     Effort: Pulmonary effort is normal. No respiratory distress.     Breath sounds: Normal breath sounds. No wheezing.  Chest:     Breasts:        Right: No mass, skin change or tenderness.        Left: No mass, skin change or tenderness.  Abdominal:     General: Bowel sounds are normal. There is no distension.     Palpations: Abdomen is soft.     Tenderness: There is no abdominal tenderness. There is no rebound.  Musculoskeletal: Normal range of motion.  Neurological:     Mental Status: She is alert and oriented to person, place, and time.     Cranial Nerves: No cranial nerve deficit.  Skin:    General: Skin is warm and dry.  Psychiatric:        Judgment: Judgment normal.  Vitals signs reviewed.   Assessment: Annual Exam 1. Women's annual routine gynecological examination   2. Screen for colon cancer    Plan:            1.  Vaginal Screening-  Pap smear schedule reviewed with patient  2. Breast screening- Exam annually and mammogram scheduled in Jan 2020  3. Colonoscopy every 10 years, Hemoccult testing after age 36  4. Labs managed by PCP  5. Counseling for hormonal therapy: none    F/U  Return in about 1 year (around 02/09/2019) for Annual.  Barnett Applebaum, MD, [facility], [facility] 02/08/2018  8:49 AM

## 2018-02-14 LAB — FECAL OCCULT BLOOD, IMMUNOCHEMICAL: Fecal Occult Bld: NEGATIVE

## 2018-03-01 ENCOUNTER — Ambulatory Visit
Admission: RE | Admit: 2018-03-01 | Discharge: 2018-03-01 | Disposition: A | Discharge status: Home / Self Care | Payer: Medicare Other | Source: Ambulatory Visit | Attending: Obstetrics & Gynecology | Admitting: Obstetrics & Gynecology

## 2018-03-01 DIAGNOSIS — Z1231 Encounter for screening mammogram for malignant neoplasm of breast: Secondary | ICD-10-CM

## 2018-09-24 ENCOUNTER — Other Ambulatory Visit: Payer: Self-pay | Admitting: Obstetrics & Gynecology

## 2018-09-24 DIAGNOSIS — Z1231 Encounter for screening mammogram for malignant neoplasm of breast: Secondary | ICD-10-CM

## 2019-02-11 ENCOUNTER — Other Ambulatory Visit: Payer: Self-pay

## 2019-02-11 ENCOUNTER — Encounter: Payer: Self-pay | Admitting: Obstetrics & Gynecology

## 2019-02-11 ENCOUNTER — Ambulatory Visit (INDEPENDENT_AMBULATORY_CARE_PROVIDER_SITE_OTHER): Payer: Medicare Other | Admitting: Obstetrics & Gynecology

## 2019-02-11 VITALS — BP 148/80 | Ht 68.0 in | Wt 235.0 lb

## 2019-02-11 DIAGNOSIS — Z01419 Encounter for gynecological examination (general) (routine) without abnormal findings: Secondary | ICD-10-CM | POA: Diagnosis not present

## 2019-02-11 DIAGNOSIS — Z1211 Encounter for screening for malignant neoplasm of colon: Secondary | ICD-10-CM

## 2019-02-11 NOTE — Patient Instructions (Signed)
PAP every 5 years Mammogram every year    Scheduled Jan 2021 Colonoscopy every 5 years    To schedule Labs yearly (with PCP)

## 2019-02-11 NOTE — Progress Notes (Signed)
HPI:      [name] is a 68 y.o. G2P1011 who LMP was in the past, she presents today for her annual examination.  The patient has no complaints today. The patient is sexually active. Herlast pap: approximate date 2016 and was normal and last mammogram: approximate date 02/2018 and was normal.  The patient does perform self breast exams.  There is notable family history of breast or ovarian cancer in her family. The patient is not taking hormone replacement therapy. Patient denies post-menopausal vaginal bleeding.   The patient has regular exercise: yes. The patient denies current symptoms of depression.    GYN Hx: Last Colonoscopy:4 years ago. Normal.  Last DEXA: 2 years ago.    PMHx: Past Medical History:  Diagnosis Date  . Abnormal Pap smear of cervix   . GERD (gastroesophageal reflux disease)   . Hypertension   . Solitary cyst of breast    Past Surgical History:  Procedure Laterality Date  . ABDOMINAL HYSTERECTOMY    . BREAST BIOPSY Right    neg  . COLONOSCOPY WITH PROPOFOL N/A 07/31/2014   Procedure: COLONOSCOPY WITH PROPOFOL;  Surgeon: Manya Silvas, MD;  Location: [facility] ENDOSCOPY;  Service: Endoscopy;  Laterality: N/A;  . ESOPHAGOGASTRODUODENOSCOPY N/A 07/31/2014   Procedure: ESOPHAGOGASTRODUODENOSCOPY (EGD);  Surgeon: Manya Silvas, MD;  Location: [facility] ENDOSCOPY;  Service: Endoscopy;  Laterality: N/A;  . GANGLION CYST EXCISION Right   . OOPHORECTOMY     Family History  Problem Relation Age of Onset  . Breast cancer Cousin 81        2nd time age 74-mat cousin  . Hypertension Mother   . Kidney failure Mother   . Diabetes Father   . Hypertension Father   . Heart attack Father    Social History   Tobacco Use  . Smoking status: Never Smoker  . Smokeless tobacco: Never Used  Substance Use Topics  . Alcohol use: No  . Drug use: No    Current Outpatient Medications:  .  amLODipine (NORVASC) 10 MG tablet, Take 10 mg by mouth daily., Disp: , Rfl:  .   hydrochlorothiazide (HYDRODIURIL) 25 MG tablet, Take 25 mg by mouth daily., Disp: , Rfl:  .  pantoprazole (PROTONIX) 40 MG tablet, Take 40 mg by mouth daily., Disp: , Rfl:  .  potassium chloride (K-DUR) 10 MEQ tablet, Take 10 mEq by mouth daily., Disp: , Rfl:  .  ranitidine (ZANTAC) 300 MG tablet, Take by mouth., Disp: , Rfl:  Allergies: Codeine, Ketoconazole, and Penicillins  Review of Systems  Constitutional: Negative for chills, fever and malaise/fatigue.  HENT: Negative for congestion, sinus pain and sore throat.   Eyes: Negative for blurred vision and pain.  Respiratory: Negative for cough and wheezing.   Cardiovascular: Negative for chest pain and leg swelling.  Gastrointestinal: Negative for abdominal pain, constipation, diarrhea, heartburn, nausea and vomiting.  Genitourinary: Negative for dysuria, frequency, hematuria and urgency.  Musculoskeletal: Negative for back pain, joint pain, myalgias and neck pain.  Skin: Negative for itching and rash.  Neurological: Negative for dizziness, tremors and weakness.  Endo/Heme/Allergies: Does not bruise/bleed easily.  Psychiatric/Behavioral: Negative for depression. The patient is not nervous/anxious and does not have insomnia.     Objective: BP (!) 148/80   Ht 5\' 8"  (1.727 m)   Wt 235 lb (106.6 kg)   BMI 35.73 kg/m   Filed Weights   02/11/19 0853  Weight: 235 lb (106.6 kg)   Body mass index is 35.73  kg/m. Physical Exam Constitutional:      General: She is not in acute distress.    Appearance: She is well-developed.  Genitourinary:     Pelvic exam was performed with patient supine.     Vagina and rectum normal.     No lesions in the vagina.     No vaginal bleeding.     No right or left adnexal mass present.     Right adnexa not tender.     Left adnexa not tender.     Genitourinary Comments: Absent Uterus Absent cervix Vaginal cuff well healed  HENT:     Head: Normocephalic and atraumatic. No laceration.     Right Ear:  Hearing normal.     Left Ear: Hearing normal.     Mouth/Throat:     Pharynx: Uvula midline.  Eyes:     Pupils: Pupils are equal, round, and reactive to light.  Neck:     Thyroid: No thyromegaly.  Cardiovascular:     Rate and Rhythm: Normal rate and regular rhythm.     Heart sounds: No murmur. No friction rub. No gallop.   Pulmonary:     Effort: Pulmonary effort is normal. No respiratory distress.     Breath sounds: Normal breath sounds. No wheezing.  Chest:     Breasts:        Right: No mass, skin change or tenderness.        Left: No mass, skin change or tenderness.  Abdominal:     General: Bowel sounds are normal. There is no distension.     Palpations: Abdomen is soft.     Tenderness: There is no abdominal tenderness. There is no rebound.  Musculoskeletal:        General: Normal range of motion.     Cervical back: Normal range of motion and neck supple.  Neurological:     Mental Status: She is alert and oriented to person, place, and time.     Cranial Nerves: No cranial nerve deficit.  Skin:    General: Skin is warm and dry.  Psychiatric:        Judgment: Judgment normal.  Vitals reviewed.     Assessment: Annual Exam 1. Women's annual routine gynecological examination   2. Screen for colon cancer     Plan:            1.  Vaginal Screening-  Pap smear schedule reviewed with patient, due 2021 (every 5 years, s/p hyst)  2. Breast screening- Exam annually and mammogram scheduled for 02/2019  3. Colonoscopy every 5 years, Hemoccult testing after age 48, [facility] referral 2021  4. Labs managed by PCP  5. Counseling for hormonal therapy: none              6. FRAX - FRAX score for assessing the 10 year probability for fracture calculated and discussed today.  Based on age and score today, DEXA is not currently scheduled.    F/U  Return in about 1 year (around 02/11/2020) for Annual.  Barnett Applebaum, MD, [facility], [facility] 02/11/2019  9:39  AM

## 2019-02-22 LAB — FECAL OCCULT BLOOD, IMMUNOCHEMICAL

## 2019-02-28 ENCOUNTER — Telehealth: Payer: Self-pay | Admitting: Obstetrics & Gynecology

## 2019-02-28 ENCOUNTER — Other Ambulatory Visit: Payer: Self-pay | Admitting: Obstetrics & Gynecology

## 2019-02-28 DIAGNOSIS — Z1211 Encounter for screening for malignant neoplasm of colon: Secondary | ICD-10-CM

## 2019-02-28 NOTE — Telephone Encounter (Signed)
Patient is calling to speak with Dr. Kenton Kingfisher nurse. No message left . Please advise

## 2019-02-28 NOTE — Telephone Encounter (Signed)
Can you put in a new order for the stool card, pt will pick it up tomorrow

## 2019-03-04 ENCOUNTER — Ambulatory Visit
Admission: RE | Admit: 2019-03-04 | Discharge: 2019-03-04 | Disposition: A | Discharge status: Home / Self Care | Payer: Medicare Other | Source: Ambulatory Visit | Attending: Obstetrics & Gynecology | Admitting: Obstetrics & Gynecology

## 2019-03-04 ENCOUNTER — Other Ambulatory Visit: Payer: Self-pay

## 2019-03-04 DIAGNOSIS — Z1231 Encounter for screening mammogram for malignant neoplasm of breast: Secondary | ICD-10-CM | POA: Diagnosis present

## 2019-03-26 ENCOUNTER — Other Ambulatory Visit: Payer: Self-pay | Admitting: Obstetrics & Gynecology

## 2019-03-26 DIAGNOSIS — Z1211 Encounter for screening for malignant neoplasm of colon: Secondary | ICD-10-CM

## 2019-04-07 ENCOUNTER — Ambulatory Visit: Payer: Medicare Other | Attending: Internal Medicine

## 2019-04-07 DIAGNOSIS — Z23 Encounter for immunization: Secondary | ICD-10-CM | POA: Insufficient documentation

## 2019-04-07 NOTE — Progress Notes (Signed)
   Covid-19 Vaccination Clinic  Name:  Emily Olson    MRN: [id number] DOB: [birth year]  04/07/2019  Emily Olson was observed post Covid-19 immunization for 15 minutes without incidence. She was provided with Vaccine Information Sheet and instruction to access the V-Safe system.   Emily Olson was instructed to call 911 with any severe reactions post vaccine: Marland Kitchen Difficulty breathing  . Swelling of your face and throat  . A fast heartbeat  . A bad rash all over your body  . Dizziness and weakness    Immunizations Administered    Name Date Dose VIS Date Route   Pfizer COVID-19 Vaccine 04/07/2019 10:01 AM 0.3 mL 01/25/2019 Intramuscular   Manufacturer: [place]   Lot: [id number]   [place]: [id number]

## 2019-04-30 ENCOUNTER — Ambulatory Visit: Payer: Medicare Other | Attending: Internal Medicine

## 2019-04-30 DIAGNOSIS — Z23 Encounter for immunization: Secondary | ICD-10-CM

## 2019-04-30 NOTE — Progress Notes (Signed)
   Covid-19 Vaccination Clinic  Name:  Emily Olson    MRN: [id number] DOB: [birth year]  04/30/2019  [name] was observed post Covid-19 immunization for 15 minutes without incident. She was provided with Vaccine Information Sheet and instruction to access the V-Safe system.   [name] was instructed to call 911 with any severe reactions post vaccine: Marland Kitchen Difficulty breathing  . Swelling of face and throat  . A fast heartbeat  . A bad rash all over body  . Dizziness and weakness   Immunizations Administered    Name Date Dose VIS Date Route   Pfizer COVID-19 Vaccine 04/30/2019  8:54 AM 0.3 mL 01/25/2019 Intramuscular   Manufacturer: [place]   Lot: [id number]   [place]: [id number]

## 2019-07-22 ENCOUNTER — Other Ambulatory Visit: Payer: Self-pay

## 2019-07-22 ENCOUNTER — Other Ambulatory Visit
Admission: RE | Admit: 2019-07-22 | Discharge: 2019-07-22 | Disposition: A | Discharge status: Home / Self Care | Payer: Medicare Other | Source: Ambulatory Visit | Attending: Internal Medicine | Admitting: Internal Medicine

## 2019-07-22 DIAGNOSIS — Z20822 Contact with and (suspected) exposure to covid-19: Secondary | ICD-10-CM | POA: Diagnosis not present

## 2019-07-22 DIAGNOSIS — Z01812 Encounter for preprocedural laboratory examination: Secondary | ICD-10-CM | POA: Diagnosis present

## 2019-07-22 LAB — SARS CORONAVIRUS 2 (TAT 6-24 HRS): SARS Coronavirus 2: NEGATIVE

## 2019-07-23 ENCOUNTER — Encounter: Payer: Self-pay | Admitting: Internal Medicine

## 2019-07-24 ENCOUNTER — Encounter: Payer: Self-pay | Admitting: Internal Medicine

## 2019-07-24 ENCOUNTER — Ambulatory Visit: Payer: Medicare Other | Admitting: Certified Registered Nurse Anesthetist

## 2019-07-24 ENCOUNTER — Encounter
Admission: RE | Disposition: A | Discharge status: Home / Self Care | Payer: Self-pay | Source: Home / Self Care | Attending: Internal Medicine

## 2019-07-24 ENCOUNTER — Ambulatory Visit
Admission: RE | Admit: 2019-07-24 | Discharge: 2019-07-24 | Disposition: A | Discharge status: Home / Self Care | Payer: Medicare Other | Attending: Internal Medicine | Admitting: Internal Medicine

## 2019-07-24 DIAGNOSIS — Z885 Allergy status to narcotic agent status: Secondary | ICD-10-CM | POA: Diagnosis not present

## 2019-07-24 DIAGNOSIS — Z880 Allergy status to penicillin: Secondary | ICD-10-CM | POA: Diagnosis not present

## 2019-07-24 DIAGNOSIS — K222 Esophageal obstruction: Secondary | ICD-10-CM | POA: Diagnosis not present

## 2019-07-24 DIAGNOSIS — Z6832 Body mass index (BMI) 32.0-32.9, adult: Secondary | ICD-10-CM | POA: Diagnosis not present

## 2019-07-24 DIAGNOSIS — Z79899 Other long term (current) drug therapy: Secondary | ICD-10-CM | POA: Insufficient documentation

## 2019-07-24 DIAGNOSIS — Z888 Allergy status to other drugs, medicaments and biological substances status: Secondary | ICD-10-CM | POA: Diagnosis not present

## 2019-07-24 DIAGNOSIS — K5730 Diverticulosis of large intestine without perforation or abscess without bleeding: Secondary | ICD-10-CM | POA: Diagnosis not present

## 2019-07-24 DIAGNOSIS — E669 Obesity, unspecified: Secondary | ICD-10-CM | POA: Diagnosis not present

## 2019-07-24 DIAGNOSIS — K2100 Gastro-esophageal reflux disease with esophagitis, without bleeding: Secondary | ICD-10-CM | POA: Insufficient documentation

## 2019-07-24 DIAGNOSIS — Z883 Allergy status to other anti-infective agents status: Secondary | ICD-10-CM | POA: Insufficient documentation

## 2019-07-24 DIAGNOSIS — I10 Essential (primary) hypertension: Secondary | ICD-10-CM | POA: Diagnosis not present

## 2019-07-24 DIAGNOSIS — K449 Diaphragmatic hernia without obstruction or gangrene: Secondary | ICD-10-CM | POA: Diagnosis not present

## 2019-07-24 DIAGNOSIS — Z86010 Personal history of colonic polyps: Secondary | ICD-10-CM | POA: Diagnosis not present

## 2019-07-24 DIAGNOSIS — K640 First degree hemorrhoids: Secondary | ICD-10-CM | POA: Insufficient documentation

## 2019-07-24 DIAGNOSIS — Z1211 Encounter for screening for malignant neoplasm of colon: Secondary | ICD-10-CM | POA: Insufficient documentation

## 2019-07-24 DIAGNOSIS — K314 Gastric diverticulum: Secondary | ICD-10-CM | POA: Insufficient documentation

## 2019-07-24 HISTORY — PX: ESOPHAGOGASTRODUODENOSCOPY (EGD) WITH PROPOFOL: SHX5813

## 2019-07-24 HISTORY — PX: COLONOSCOPY WITH PROPOFOL: SHX5780

## 2019-07-24 SURGERY — ESOPHAGOGASTRODUODENOSCOPY (EGD) WITH PROPOFOL
Anesthesia: General

## 2019-07-24 MED ORDER — PROPOFOL 10 MG/ML IV BOLUS
INTRAVENOUS | Status: DC | PRN
Start: 2019-07-24 — End: 2019-07-24
  Administered 2019-07-24: 80 mg via INTRAVENOUS
  Administered 2019-07-24: 20 mg via INTRAVENOUS
  Administered 2019-07-24: 40 mg via INTRAVENOUS
  Administered 2019-07-24: 30 mg via INTRAVENOUS

## 2019-07-24 MED ORDER — SODIUM CHLORIDE 0.9 % IV SOLN: INTRAVENOUS | Status: DC

## 2019-07-24 MED ORDER — EPHEDRINE 5 MG/ML INJ
INTRAVENOUS | Status: AC
  Filled 2019-07-24: qty 10

## 2019-07-24 MED ORDER — EPHEDRINE SULFATE 50 MG/ML IJ SOLN
INTRAMUSCULAR | Status: DC | PRN
  Administered 2019-07-24: 10 mg via INTRAVENOUS
  Administered 2019-07-24: 5 mg via INTRAVENOUS
  Administered 2019-07-24: 10 mg via INTRAVENOUS

## 2019-07-24 MED ORDER — LIDOCAINE HCL (CARDIAC) PF 100 MG/5ML IV SOSY
PREFILLED_SYRINGE | INTRAVENOUS | Status: DC | PRN
  Administered 2019-07-24: 100 mg via INTRAVENOUS

## 2019-07-24 MED ORDER — PROPOFOL 500 MG/50ML IV EMUL
INTRAVENOUS | Status: DC | PRN
  Administered 2019-07-24: 100 ug/kg/min via INTRAVENOUS

## 2019-07-24 MED ORDER — LIDOCAINE HCL (PF) 2 % IJ SOLN
INTRAMUSCULAR | Status: AC
  Filled 2019-07-24: qty 10

## 2019-07-24 MED ORDER — PROPOFOL 10 MG/ML IV BOLUS
INTRAVENOUS | Status: AC
  Filled 2019-07-24: qty 80

## 2019-07-24 NOTE — Addendum Note (Signed)
Addendum  created 07/24/19 1638 by Hedda Slade, CRNA   Intraprocedure Event deleted

## 2019-07-24 NOTE — Transfer of Care (Signed)
Immediate Anesthesia Transfer of Care Note  Patient: Emily Olson  Procedure(s) Performed: ESOPHAGOGASTRODUODENOSCOPY (EGD) WITH PROPOFOL (N/A ) COLONOSCOPY WITH PROPOFOL (N/A )  Patient Location: PACU and Endoscopy Unit  Anesthesia Type:General  Level of Consciousness: awake, oriented, drowsy and patient cooperative  Airway & Oxygen Therapy: Patient Spontanous Breathing  Post-op Assessment: Report given to RN, Post -op Vital signs reviewed and stable and Patient moving all extremities  Post vital signs: Reviewed and stable  Last Vitals:  Vitals Value Taken Time  BP 113/49 07/24/19 1010  Temp 36.2 C 07/24/19 1010  Pulse 86 07/24/19 1010  Resp 17 07/24/19 1010  SpO2 94 % 07/24/19 1010  Vitals shown include unvalidated device data.  Last Pain:  Vitals:   07/24/19 0834  TempSrc: Temporal  PainSc: 0-No pain         Complications: No apparent anesthesia complications

## 2019-07-24 NOTE — Anesthesia Preprocedure Evaluation (Signed)
Anesthesia Evaluation  Patient identified by MRN, date of birth, ID band Patient awake    Reviewed: Allergy & Precautions, NPO status , Patient's Chart, lab work & pertinent test results  History of Anesthesia Complications Negative for: history of anesthetic complications  Airway Mallampati: II  TM Distance: >3 FB Neck ROM: Full    Dental no notable dental hx.    Pulmonary neg pulmonary ROS, neg sleep apnea, neg COPD,    breath sounds clear to auscultation- rhonchi (-) wheezing      Cardiovascular hypertension, Pt. on medications (-) CAD, (-) Past MI, (-) Cardiac Stents and (-) CABG  Rhythm:Regular Rate:Normal - Systolic murmurs and - Diastolic murmurs    Neuro/Psych neg Seizures negative neurological ROS  negative psych ROS   GI/Hepatic Neg liver ROS, GERD  ,  Endo/Other  negative endocrine ROSneg diabetes  Renal/GU negative Renal ROS     Musculoskeletal negative musculoskeletal ROS (+)   Abdominal (+) + obese,   Peds  Hematology negative hematology ROS (+)   Anesthesia Other Findings Past Medical History: No date: Abnormal Pap smear of cervix No date: GERD (gastroesophageal reflux disease) No date: Hypertension No date: Solitary cyst of breast   Reproductive/Obstetrics                             Anesthesia Physical Anesthesia Plan  ASA: II  Anesthesia Plan: General   Post-op Pain Management:    Induction: Intravenous  PONV Risk Score and Plan: 2 and Propofol infusion  Airway Management Planned: Natural Airway  Additional Equipment:   Intra-op Plan:   Post-operative Plan:   Informed Consent: I have reviewed the patients History and Physical, chart, labs and discussed the procedure including the risks, benefits and alternatives for the proposed anesthesia with the patient or authorized representative who has indicated his/her understanding and acceptance.     Dental  advisory given  Plan Discussed with: CRNA and Anesthesiologist  Anesthesia Plan Comments:         Anesthesia Quick Evaluation

## 2019-07-24 NOTE — Anesthesia Postprocedure Evaluation (Signed)
Anesthesia Post Note  Patient: [name]  Procedure(s) Performed: ESOPHAGOGASTRODUODENOSCOPY (EGD) WITH PROPOFOL (N/A ) COLONOSCOPY WITH PROPOFOL (N/A )  Patient location during evaluation: Endoscopy Anesthesia Type: General Level of consciousness: awake and alert and oriented Pain management: pain level controlled Vital Signs Assessment: post-procedure vital signs reviewed and stable Respiratory status: spontaneous breathing, nonlabored ventilation and respiratory function stable Cardiovascular status: blood pressure returned to baseline and stable Postop Assessment: no signs of nausea or vomiting Anesthetic complications: no     Last Vitals:  Vitals:   07/24/19 1010 07/24/19 1020  BP: (!) 113/49 116/62  Pulse: 86 71  Resp: (!) 25 (!) 24  Temp: (!) 36.2 C   SpO2: 92% 97%    Last Pain:  Vitals:   07/24/19 0834  TempSrc: Temporal  PainSc: 0-No pain                 Godson Pollan

## 2019-07-24 NOTE — Interval H&P Note (Signed)
History and Physical Interval Note:  07/24/2019 9:33 AM  Emily Olson  has presented today for surgery, with the diagnosis of colon cancer screening.  The various methods of treatment have been discussed with the patient and family. After consideration of risks, benefits and other options for treatment, the patient has consented to  Procedure(s): ESOPHAGOGASTRODUODENOSCOPY (EGD) WITH PROPOFOL (N/A) COLONOSCOPY WITH PROPOFOL (N/A) as a surgical intervention.  The patient's history has been reviewed, patient examined, no change in status, stable for surgery.  I have reviewed the patient's chart and labs.  Questions were answered to the patient's satisfaction.     [place], [place]

## 2019-07-24 NOTE — Op Note (Signed)
[facility] Gastroenterology Patient Name: Emily Olson Procedure Date: 07/24/2019 9:29 AM MRN: [id number] Account #: [id number] Date of Birth: 1950-08-25 Admit Type: Outpatient Age: 69 Room: [facility] ENDO ROOM 3 Gender: Female Note Status: Finalized Procedure:             Upper GI endoscopy Indications:           Follow-up of gastro-esophageal reflux disease, Failure                         to respond to medical treatment Providers:             Benay Pike. Alice Reichert MD, MD Referring MD:          Ocie Cornfield. Ouida Sills MD, MD (Referring MD) Medicines:             Propofol per Anesthesia Complications:         No immediate complications. Procedure:             Pre-Anesthesia Assessment:                        - The risks and benefits of the procedure and the                         sedation options and risks were discussed with the                         patient. All questions were answered and informed                         consent was obtained.                        - Patient identification and proposed procedure were                         verified prior to the procedure by the nurse. The                         procedure was verified in the procedure room.                        - ASA Grade Assessment: III - A patient with severe                         systemic disease.                        - After reviewing the risks and benefits, the patient                         was deemed in satisfactory condition to undergo the                         procedure.                        After obtaining informed consent, the endoscope was                         passed under  direct vision. Throughout the procedure,                         the patient's blood pressure, pulse, and oxygen                         saturations were monitored continuously. The Endoscope                         was introduced through the mouth, and advanced to the                         third part  of duodenum. The upper GI endoscopy was                         accomplished without difficulty. The patient tolerated                         the procedure well. Findings:      The examined esophagus was normal. Biopsies were obtained from the       proximal and distal esophagus with cold forceps for histology of       suspected eosinophilic esophagitis.      A non-obstructing Schatzki ring was found in the distal esophagus.      A 2 cm hiatal hernia was present.      The examined duodenum was normal.      A medium non-bleeding diverticulum was found in the gastric fundus.      The exam was otherwise without abnormality. Impression:            - Normal esophagus. Biopsied.                        - Non-obstructing Schatzki ring.                        - 2 cm hiatal hernia.                        - Normal examined duodenum.                        - The examination was otherwise normal. Recommendation:        - Await pathology results.                        - Proceed with colonoscopy Procedure Code(s):     --- Professional ---                        [id number], Esophagogastroduodenoscopy, flexible,                         transoral; with biopsy, single or multiple Diagnosis Code(s):     --- Professional ---                        K21.9, Gastro-esophageal reflux disease without                         esophagitis  K44.9, Diaphragmatic hernia without obstruction or                         gangrene                        K22.2, Esophageal obstruction CPT copyright 2019 American Medical Association. All rights reserved. The codes documented in this report are preliminary and upon coder review may  be revised to meet current compliance requirements. Efrain Sella MD, MD 07/24/2019 9:52:06 AM This report has been signed electronically. Number of Addenda: 0 Note Initiated On: 07/24/2019 9:29 AM Estimated Blood Loss:  Estimated blood loss: none. Estimated blood loss:  none.      [facility]

## 2019-07-24 NOTE — H&P (Signed)
Outpatient short stay form Pre-procedure 07/24/2019 9:30 AM Emily Olson K. Alice Reichert, M.D.  Primary Physician: Frazier Richards, M.D.  Reason for visit:  GERD intractable with suboptimal response to H2blockers, personal hx of adenomatous colon polyps  History of present illness:  Patient is a pleasant 69 y/o female with history of GERD with haphazard use of H2 blockers and until recently, NO use of PPI therapy. No dysphagia but pyrosis and waterbrash symptoms continue.                           Patient presents for colonoscopy for a personal hx of colon polyps. The patient denies abdominal pain, abnormal weight loss or rectal bleeding.      Current Facility-Administered Medications:  .  0.9 %  sodium chloride infusion, , Intravenous, Continuous, [place], Benay Pike, MD, Last Rate: 20 mL/hr at 07/24/19 0905, New Bag at 07/24/19 0905  Medications Prior to Admission  Medication Sig Dispense Refill Last Dose  . amLODipine (NORVASC) 10 MG tablet Take 10 mg by mouth daily.   07/24/2019 at Unknown time  . famotidine (PEPCID) 40 MG tablet Take 40 mg by mouth daily.   07/23/2019 at Unknown time  . hydrochlorothiazide (HYDRODIURIL) 25 MG tablet Take 25 mg by mouth daily.   07/24/2019 at Unknown time  . Multiple Vitamins-Minerals (WOMENS 50+ MULTI VITAMIN/MIN PO) Take 1 tablet by mouth daily.   Past Week at Unknown time  . pantoprazole (PROTONIX) 40 MG tablet Take 40 mg by mouth daily.   Not Taking at Unknown time  . potassium chloride (K-DUR) 10 MEQ tablet Take 10 mEq by mouth daily.   Not Taking at Unknown time  . ranitidine (ZANTAC) 300 MG tablet Take by mouth.        Allergies  Allergen Reactions  . Codeine Other (See Comments)  . Ketoconazole Palpitations  . Penicillins Rash     Past Medical History:  Diagnosis Date  . Abnormal Pap smear of cervix   . GERD (gastroesophageal reflux disease)   . Hypertension   . Solitary cyst of breast     Review of systems:  Otherwise negative.    Physical  Exam  Gen: Alert, oriented. Appears stated age.  HEENT: [place]/AT. PERRLA. Lungs: CTA, no wheezes. CV: RR nl S1, S2. Abd: soft, benign, no masses. BS+ Ext: No edema. Pulses 2+    Planned procedures: Proceed with EGD and colonoscopy. The patient understands the nature of the planned procedure, indications, risks, alternatives and potential complications including but not limited to bleeding, infection, perforation, damage to internal organs and possible oversedation/side effects from anesthesia. The patient agrees and gives consent to proceed.  Please refer to procedure notes for findings, recommendations and patient disposition/instructions.     Shavonne Ambroise K. Alice Reichert, M.D. Gastroenterology 07/24/2019  9:30 AM

## 2019-07-24 NOTE — Op Note (Addendum)
[facility] Gastroenterology Patient Name: Emily Olson Procedure Date: 07/24/2019 9:28 AM MRN: [id number] Account #: [id number] Date of Birth: 25-Nov-1950 Admit Type: Outpatient Age: 69 Room: [facility] ENDO ROOM 3 Gender: Female Note Status: Supervisor Override Procedure:             Colonoscopy Indications:           Personal history of colonic polyps Providers:             Benay Pike. Keya Wynes MD, MD Medicines:             Propofol per Anesthesia Complications:         No immediate complications. Procedure:             Pre-Anesthesia Assessment:                        - The risks and benefits of the procedure and the                         sedation options and risks were discussed with the                         patient. All questions were answered and informed                         consent was obtained.                        - Patient identification and proposed procedure were                         verified prior to the procedure by the nurse. The                         procedure was verified in the procedure room.                        - ASA Grade Assessment: III - A patient with severe                         systemic disease.                        - After reviewing the risks and benefits, the patient                         was deemed in satisfactory condition to undergo the                         procedure.                        After obtaining informed consent, the colonoscope was                         passed under direct vision. Throughout the procedure,                         the patient's blood pressure, pulse, and oxygen  saturations were monitored continuously. The                         Colonoscope was introduced through the anus and                         advanced to the the cecum, identified by appendiceal                         orifice and ileocecal valve. The colonoscopy was                         performed  without difficulty. The patient tolerated                         the procedure well. The quality of the bowel                         preparation was good. The ileocecal valve, appendiceal                         orifice, and rectum were photographed. Findings:      The perianal and digital rectal examinations were normal. Pertinent       negatives include normal sphincter tone and no palpable rectal lesions.      Many small-mouthed diverticula were found in the sigmoid colon.      Non-bleeding internal hemorrhoids were found during retroflexion. The       hemorrhoids were Grade I (internal hemorrhoids that do not prolapse).      The exam was otherwise without abnormality. Impression:            - Diverticulosis in the sigmoid colon.                        - Non-bleeding internal hemorrhoids.                        - The examination was otherwise normal.                        - No specimens collected. Recommendation:        - Patient has a contact number available for                         emergencies. The signs and symptoms of potential                         delayed complications were discussed with the patient.                         Return to normal activities tomorrow. Written                         discharge instructions were provided to the patient.                        - Await pathology results from EGD, also performed  today.                        - Resume previous diet.                        - Continue present medications.                        - Repeat colonoscopy in 10 years for screening                         purposes.                        - Return to physician assistant in 6 weeks.                        - Please follow up with Effie Berkshire, PA-C in 6                         weeks Procedure Code(s):     --- Professional ---                        [phone], Colorectal cancer screening; colonoscopy on                         individual  not meeting criteria for high risk Diagnosis Code(s):     --- Professional ---                        K57.30, Diverticulosis of large intestine without                         perforation or abscess without bleeding                        K64.0, First degree hemorrhoids                        Z12.11, Encounter for screening for malignant neoplasm                         of colon CPT copyright 2019 American Medical Association. All rights reserved. The codes documented in this report are preliminary and upon coder review may  be revised to meet current compliance requirements. Efrain Sella MD, MD 07/24/2019 10:17:43 AM This report has been signed electronically. Number of Addenda: 0 Note Initiated On: 07/24/2019 9:28 AM Scope Withdrawal Time: 0 hours 6 minutes 10 seconds  Total Procedure Duration: 0 hours 11 minutes 4 seconds  Estimated Blood Loss:  Estimated blood loss: none.      [facility]

## 2019-07-25 ENCOUNTER — Encounter: Payer: Self-pay | Admitting: Internal Medicine

## 2019-07-25 LAB — SURGICAL PATHOLOGY

## 2019-10-18 ENCOUNTER — Other Ambulatory Visit: Payer: Self-pay | Admitting: Obstetrics & Gynecology

## 2019-10-18 DIAGNOSIS — Z1231 Encounter for screening mammogram for malignant neoplasm of breast: Secondary | ICD-10-CM

## 2020-02-12 ENCOUNTER — Encounter: Payer: Self-pay | Admitting: Obstetrics & Gynecology

## 2020-02-12 ENCOUNTER — Ambulatory Visit (INDEPENDENT_AMBULATORY_CARE_PROVIDER_SITE_OTHER): Payer: Medicare Other | Admitting: Obstetrics & Gynecology

## 2020-02-12 ENCOUNTER — Other Ambulatory Visit: Payer: Self-pay

## 2020-02-12 ENCOUNTER — Other Ambulatory Visit (HOSPITAL_COMMUNITY)
Admission: RE | Admit: 2020-02-12 | Discharge: 2020-02-12 | Disposition: A | Discharge status: Home / Self Care | Payer: Medicare Other | Source: Ambulatory Visit | Attending: Obstetrics & Gynecology | Admitting: Obstetrics & Gynecology

## 2020-02-12 VITALS — BP 120/80 | Ht 67.5 in | Wt 230.0 lb

## 2020-02-12 DIAGNOSIS — Z01419 Encounter for gynecological examination (general) (routine) without abnormal findings: Secondary | ICD-10-CM

## 2020-02-12 DIAGNOSIS — Z1272 Encounter for screening for malignant neoplasm of vagina: Secondary | ICD-10-CM | POA: Insufficient documentation

## 2020-02-12 NOTE — Progress Notes (Signed)
HPI:      [name] is a 69 y.o. G2P1011 who LMP was in the past, she presents today for her annual examination.  The patient has no complaints today. The patient is sexually active. Herlast pap: approximate date 2016 and was normal and last mammogram: approximate date 2020 and was normal.  The patient does perform self breast exams.  There is no notable family history of breast or ovarian cancer in her family. The patient is not taking hormone replacement therapy. Patient denies post-menopausal vaginal bleeding.   The patient has regular exercise: yes. The patient denies current symptoms of depression.    GYN Hx: Last Colonoscopy:few mos ago. Normal.  Recommended for q 10 yrs. Last DEXA: 3 years ago.    PMHx: Past Medical History:  Diagnosis Date  . Abnormal Pap smear of cervix   . GERD (gastroesophageal reflux disease)   . Hypertension   . Solitary cyst of breast    Past Surgical History:  Procedure Laterality Date  . ABDOMINAL HYSTERECTOMY    . BREAST BIOPSY Right    neg  . COLONOSCOPY WITH PROPOFOL N/A 07/31/2014   Procedure: COLONOSCOPY WITH PROPOFOL;  Surgeon: Scot Jun, MD;  Location: [facility] ENDOSCOPY;  Service: Endoscopy;  Laterality: N/A;  . COLONOSCOPY WITH PROPOFOL N/A 07/24/2019   Procedure: COLONOSCOPY WITH PROPOFOL;  Surgeon: Toledo, Boykin Nearing, MD;  Location: [facility] ENDOSCOPY;  Service: Gastroenterology;  Laterality: N/A;  . ESOPHAGOGASTRODUODENOSCOPY N/A 07/31/2014   Procedure: ESOPHAGOGASTRODUODENOSCOPY (EGD);  Surgeon: Scot Jun, MD;  Location: [facility] ENDOSCOPY;  Service: Endoscopy;  Laterality: N/A;  . ESOPHAGOGASTRODUODENOSCOPY (EGD) WITH PROPOFOL N/A 07/24/2019   Procedure: ESOPHAGOGASTRODUODENOSCOPY (EGD) WITH PROPOFOL;  Surgeon: Toledo, Boykin Nearing, MD;  Location: [facility] ENDOSCOPY;  Service: Gastroenterology;  Laterality: N/A;  . GANGLION CYST EXCISION Right   . OOPHORECTOMY     Family History  Problem Relation Age of Onset  . Breast cancer Cousin  47        2nd time age 81-mat cousin  . Hypertension Mother   . Kidney failure Mother   . Diabetes Father   . Hypertension Father   . Heart attack Father    Social History   Tobacco Use  . Smoking status: Never Smoker  . Smokeless tobacco: Never Used  Vaping Use  . Vaping Use: Never used  Substance Use Topics  . Alcohol use: No  . Drug use: No    Current Outpatient Medications:  .  amLODipine (NORVASC) 10 MG tablet, Take 10 mg by mouth daily., Disp: , Rfl:  .  famotidine (PEPCID) 40 MG tablet, Take 40 mg by mouth daily., Disp: , Rfl:  .  hydrochlorothiazide (HYDRODIURIL) 25 MG tablet, Take 25 mg by mouth daily., Disp: , Rfl:  .  Multiple Vitamins-Minerals (WOMENS 50+ MULTI VITAMIN/MIN PO), Take 1 tablet by mouth daily., Disp: , Rfl:  .  potassium chloride (K-DUR) 10 MEQ tablet, Take 10 mEq by mouth daily., Disp: , Rfl:  .  pantoprazole (PROTONIX) 40 MG tablet, Take 40 mg by mouth daily. (Patient not taking: Reported on 02/12/2020), Disp: , Rfl:  .  ranitidine (ZANTAC) 300 MG tablet, Take by mouth., Disp: , Rfl:  Allergies: Codeine, Ketoconazole, and Penicillins  Review of Systems  Constitutional: Negative for chills, fever and malaise/fatigue.  HENT: Negative for congestion, sinus pain and sore throat.   Eyes: Negative for blurred vision and pain.  Respiratory: Negative for cough and wheezing.   Cardiovascular: Negative for chest pain and leg swelling.  Gastrointestinal: Negative for abdominal pain, constipation, diarrhea, heartburn, nausea and vomiting.  Genitourinary: Negative for dysuria, frequency, hematuria and urgency.  Musculoskeletal: Negative for back pain, joint pain, myalgias and neck pain.  Skin: Negative for itching and rash.  Neurological: Negative for dizziness, tremors and weakness.  Endo/Heme/Allergies: Does not bruise/bleed easily.  Psychiatric/Behavioral: Negative for depression. The patient is not nervous/anxious and does not have insomnia.      Objective: BP 120/80   Ht 5' 7.5" (1.715 m)   Wt 230 lb (104.3 kg)   BMI 35.49 kg/m   Filed Weights   02/12/20 0950  Weight: 230 lb (104.3 kg)   Body mass index is 35.49 kg/m. Physical Exam Constitutional:      General: She is not in acute distress.    Appearance: She is well-developed.  Genitourinary:     Vulva, urethra, bladder, vagina and rectum normal.     No lesions in the vagina.     Genitourinary Comments: Vaginal cuff well healed     No vaginal bleeding.      Right Adnexa: not tender and no mass present.    Left Adnexa: not tender and no mass present.    Cervix is absent.     Uterus is absent.     Pelvic exam was performed with patient supine.  Breasts:     Right: No mass, skin change or tenderness.     Left: No mass, skin change or tenderness.    HENT:     Head: Normocephalic and atraumatic. No laceration.     Right Ear: Hearing normal.     Left Ear: Hearing normal.     Mouth/Throat:     Pharynx: Uvula midline.  Eyes:     Pupils: Pupils are equal, round, and reactive to light.  Neck:     Thyroid: No thyromegaly.  Cardiovascular:     Rate and Rhythm: Normal rate and regular rhythm.     Heart sounds: No murmur heard. No friction rub. No gallop.   Pulmonary:     Effort: Pulmonary effort is normal. No respiratory distress.     Breath sounds: Normal breath sounds. No wheezing.  Abdominal:     General: Bowel sounds are normal. There is no distension.     Palpations: Abdomen is soft.     Tenderness: There is no abdominal tenderness. There is no rebound.  Musculoskeletal:        General: Normal range of motion.     Cervical back: Normal range of motion and neck supple.  Neurological:     Mental Status: She is alert and oriented to person, place, and time.     Cranial Nerves: No cranial nerve deficit.  Skin:    General: Skin is warm and dry.  Psychiatric:        Judgment: Judgment normal.  Vitals reviewed.     Assessment: Annual Exam 1. Women's  annual routine gynecological examination   2. Screening for vaginal cancer     Plan:            1.  Cervical Screening-  Pap smear done today  2. Breast screening- Exam annually and mammogram scheduled  3. Colonoscopy every 10 years, Hemoccult testing after age 42  4. Labs managed by PCP  5. Counseling for hormonal therapy: none              6. FRAX - FRAX score for assessing the 10 year probability for fracture calculated and discussed today.  Based on  age and score today, DEXA is not scheduled.    F/U  Return in about 1 year (around 02/11/2021) for Annual.  Barnett Applebaum, MD, [facility], [facility] 02/12/2020  9:59 AM

## 2020-02-12 NOTE — Patient Instructions (Signed)
PAP every three years Mammogram every year (scheduled in Jan) Colonoscopy every 10 years Labs yearly (with PCP)  Thank you for choosing [facility]. As part of our ongoing efforts to improve patient experience, we would appreciate your feedback. Please fill out the short survey that you will receive by mail or MyChart. Your opinion is important to [place]! - Dr. Tiburcio Pea  Recommendations to boost your immunity to prevent illness such as viral flu and colds, including covid19, are as follows: Vitamin K2 and Vitamin D3. Take Vitamin K2 at 200-300 mcg daily (usually 2-3 pills daily of the over the counter formulation). Take Vitamin D3 at 3000-4000 U daily (usually 3-4 pills daily of the over the counter formulation). Studies show that these two at high normal levels in your system are very effective in keeping your immunity so strong and protective that you will be unlikely to contract viral illness such as those listed above.  Dr Tiburcio Pea

## 2020-02-13 LAB — CYTOLOGY - PAP: Diagnosis: NEGATIVE

## 2020-03-04 ENCOUNTER — Ambulatory Visit
Admission: RE | Admit: 2020-03-04 | Discharge: 2020-03-04 | Disposition: A | Discharge status: Home / Self Care | Payer: Medicare Other | Source: Ambulatory Visit | Attending: Obstetrics & Gynecology | Admitting: Obstetrics & Gynecology

## 2020-03-04 ENCOUNTER — Other Ambulatory Visit: Payer: Self-pay

## 2020-03-04 DIAGNOSIS — Z1231 Encounter for screening mammogram for malignant neoplasm of breast: Secondary | ICD-10-CM | POA: Insufficient documentation

## 2020-09-01 ENCOUNTER — Other Ambulatory Visit: Payer: Self-pay | Admitting: Obstetrics & Gynecology

## 2020-09-01 DIAGNOSIS — Z1231 Encounter for screening mammogram for malignant neoplasm of breast: Secondary | ICD-10-CM

## 2021-02-12 ENCOUNTER — Encounter: Payer: Self-pay | Admitting: Obstetrics & Gynecology

## 2021-02-12 ENCOUNTER — Ambulatory Visit (INDEPENDENT_AMBULATORY_CARE_PROVIDER_SITE_OTHER): Payer: Medicare Other | Admitting: Obstetrics & Gynecology

## 2021-02-12 ENCOUNTER — Other Ambulatory Visit: Payer: Self-pay

## 2021-02-12 VITALS — BP 138/80 | Ht 67.5 in | Wt 230.0 lb

## 2021-02-12 DIAGNOSIS — Z01419 Encounter for gynecological examination (general) (routine) without abnormal findings: Secondary | ICD-10-CM

## 2021-02-12 NOTE — Progress Notes (Signed)
HPI:      Emily Olson is a 70 y.o. G2P1011 who LMP was in the past, she presents today for her annual examination.  The patient has no complaints today. The patient is sexually active. Herlast pap: approximate date 2021 and was normal and last mammogram: approximate date 2022 and was normal.  The patient does perform self breast exams.  There is notable family history of breast or ovarian cancer in her family. The patient is not taking hormone replacement therapy. Patient denies post-menopausal vaginal bleeding.   The patient has regular exercise: yes. The patient denies current symptoms of depression.    GYN Hx: Last Colonoscopy:1 year ago. Normal.  Last DEXA: 4 years ago.    PMHx: Past Medical History:  Diagnosis Date   Abnormal Pap smear of cervix    GERD (gastroesophageal reflux disease)    Hypertension    Solitary cyst of breast    Past Surgical History:  Procedure Laterality Date   ABDOMINAL HYSTERECTOMY     BREAST BIOPSY Right    neg   COLONOSCOPY WITH PROPOFOL N/A 07/31/2014   Procedure: COLONOSCOPY WITH PROPOFOL;  Surgeon: Manya Silvas, MD;  Location: [place];  Service: Endoscopy;  Laterality: N/A;   COLONOSCOPY WITH PROPOFOL N/A 07/24/2019   Procedure: COLONOSCOPY WITH PROPOFOL;  Surgeon: Toledo, Benay Pike, MD;  Location: [facility] ENDOSCOPY;  Service: Gastroenterology;  Laterality: N/A;   ESOPHAGOGASTRODUODENOSCOPY N/A 07/31/2014   Procedure: ESOPHAGOGASTRODUODENOSCOPY (EGD);  Surgeon: Manya Silvas, MD;  Location: [facility] ENDOSCOPY;  Service: Endoscopy;  Laterality: N/A;   ESOPHAGOGASTRODUODENOSCOPY (EGD) WITH PROPOFOL N/A 07/24/2019   Procedure: ESOPHAGOGASTRODUODENOSCOPY (EGD) WITH PROPOFOL;  Surgeon: Toledo, Benay Pike, MD;  Location: [facility] ENDOSCOPY;  Service: Gastroenterology;  Laterality: N/A;   GANGLION CYST EXCISION Right    OOPHORECTOMY Bilateral    Family History  Problem Relation Age of Onset   Hypertension Mother    Kidney failure Mother     Diabetes Father    Hypertension Father    Heart attack Father    Heart attack Brother    Breast cancer Cousin 77        2nd time age 47-mat cousin   Social History   Tobacco Use   Smoking status: Never   Smokeless tobacco: Never  Vaping Use   Vaping Use: Never used  Substance Use Topics   Alcohol use: No   Drug use: No    Current Outpatient Medications:    amLODipine (NORVASC) 10 MG tablet, Take 10 mg by mouth daily., Disp: , Rfl:    famotidine (PEPCID) 40 MG tablet, Take 40 mg by mouth daily., Disp: , Rfl:    hydrochlorothiazide (HYDRODIURIL) 25 MG tablet, Take 25 mg by mouth daily., Disp: , Rfl:    Multiple Vitamins-Minerals (WOMENS 50+ MULTI VITAMIN/MIN PO), Take 1 tablet by mouth daily., Disp: , Rfl:    potassium chloride (K-DUR) 10 MEQ tablet, Take 10 mEq by mouth daily., Disp: , Rfl:    pantoprazole (PROTONIX) 40 MG tablet, Take 40 mg by mouth daily. (Patient not taking: Reported on 02/12/2020), Disp: , Rfl:    ranitidine (ZANTAC) 300 MG tablet, Take by mouth., Disp: , Rfl:  Allergies: Codeine, Ketoconazole, and Penicillins  Review of Systems  Constitutional:  Negative for chills, fever and malaise/fatigue.  HENT:  Negative for congestion, sinus pain and sore throat.   Eyes:  Negative for blurred vision and pain.  Respiratory:  Negative for cough and wheezing.   Cardiovascular:  Negative for chest pain  and leg swelling.  Gastrointestinal:  Negative for abdominal pain, constipation, diarrhea, heartburn, nausea and vomiting.  Genitourinary:  Negative for dysuria, frequency, hematuria and urgency.  Musculoskeletal:  Negative for back pain, joint pain, myalgias and neck pain.  Skin:  Negative for itching and rash.  Neurological:  Negative for dizziness, tremors and weakness.  Endo/Heme/Allergies:  Does not bruise/bleed easily.  Psychiatric/Behavioral:  Negative for depression. The patient is not nervous/anxious and does not have insomnia.    Objective: BP 138/80    Ht 5'  7.5" (1.715 m)    Wt 230 lb (104.3 kg)    BMI 35.49 kg/m   Filed Weights   02/12/21 0848  Weight: 230 lb (104.3 kg)   Body mass index is 35.49 kg/m. Physical Exam Constitutional:      General: She is not in acute distress.    Appearance: She is well-developed.  Genitourinary:     Vulva, bladder, rectum and urethral meatus normal.     No lesions in the vagina.     Genitourinary Comments: Vaginal cuff well healed     Right Labia: No rash, tenderness or lesions.    Left Labia: No tenderness, lesions or rash.    No vaginal bleeding.      Right Adnexa: not tender and no mass present.    Left Adnexa: not tender and no mass present.    Cervix is absent.     Uterus is absent.     Pelvic exam was performed with patient in the lithotomy position.  Breasts:    Right: No mass, skin change or tenderness.     Left: No mass, skin change or tenderness.  HENT:     Head: Normocephalic and atraumatic. No laceration.     Right Ear: Hearing normal.     Left Ear: Hearing normal.     Mouth/Throat:     Pharynx: Uvula midline.  Eyes:     Pupils: Pupils are equal, round, and reactive to light.  Neck:     Thyroid: No thyromegaly.  Cardiovascular:     Rate and Rhythm: Normal rate and regular rhythm.     Heart sounds: No murmur heard.   No friction rub. No gallop.  Pulmonary:     Effort: Pulmonary effort is normal. No respiratory distress.     Breath sounds: Normal breath sounds. No wheezing.  Abdominal:     General: Bowel sounds are normal. There is no distension.     Palpations: Abdomen is soft.     Tenderness: There is no abdominal tenderness. There is no rebound.  Musculoskeletal:        General: Normal range of motion.     Cervical back: Normal range of motion and neck supple.  Neurological:     Mental Status: She is alert and oriented to person, place, and time.     Cranial Nerves: No cranial nerve deficit.  Skin:    General: Skin is warm and dry.  Psychiatric:        Judgment:  Judgment normal.  Vitals reviewed.    Assessment: Annual Exam 1. Women's annual routine gynecological examination     Plan:            1.  Vaginal Screening-  Pap smear schedule reviewed with patient (every 5 yrs)  2. Breast screening- Exam annually and mammogram scheduled  3. Colonoscopy every 10 years, Hemoccult testing after age 36  4. Labs managed by PCP  5. Counseling for hormonal therapy: none  6. FRAX - FRAX score for assessing the 10 year probability for fracture calculated and discussed today.  Based on age and score today, DEXA is not currently scheduled.    F/U  Return in about 1 year (around 02/12/2022) for Annual.  Barnett Applebaum, MD, [facility], [facility] 02/12/2021  9:24 AM

## 2021-02-12 NOTE — Patient Instructions (Signed)
PAP every 5 years Mammogram every year Colonoscopy every 10 years Labs yearly (with PCP)  Thank you for choosing [facility]. As part of our ongoing efforts to improve patient experience, we would appreciate your feedback. Please fill out the short survey that you will receive by mail or MyChart. Your opinion is important to [place]! - Dr. Kenton Kingfisher  Recommendations to boost your immunity to prevent illness such as viral flu and colds, including covid19, are as follows:       - - -  Vitamin K2 and Vitamin D3  - - - Take Vitamin K2 at 200-300 mcg daily (usually 2-3 pills daily of the over the counter formulation). Take Vitamin D3 at 3000-4000 U daily (usually 3-4 pills daily of the over the counter formulation). Studies show that these two at high normal levels in your system are very effective in keeping your immunity so strong and protective that you will be unlikely to contract viral illness such as those listed above.  Dr Kenton Kingfisher

## 2021-03-09 ENCOUNTER — Ambulatory Visit
Admission: RE | Admit: 2021-03-09 | Discharge: 2021-03-09 | Disposition: A | Discharge status: Home / Self Care | Payer: Medicare Other | Source: Ambulatory Visit | Attending: Obstetrics & Gynecology | Admitting: Obstetrics & Gynecology

## 2021-03-09 ENCOUNTER — Other Ambulatory Visit: Payer: Self-pay

## 2021-03-09 DIAGNOSIS — Z1231 Encounter for screening mammogram for malignant neoplasm of breast: Secondary | ICD-10-CM | POA: Diagnosis present

## 2021-06-23 ENCOUNTER — Other Ambulatory Visit: Payer: Self-pay | Admitting: Orthopedic Surgery

## 2021-06-23 DIAGNOSIS — M1711 Unilateral primary osteoarthritis, right knee: Secondary | ICD-10-CM

## 2021-07-09 ENCOUNTER — Ambulatory Visit
Admission: RE | Admit: 2021-07-09 | Discharge: 2021-07-09 | Disposition: A | Discharge status: Home / Self Care | Payer: Medicare Other | Source: Ambulatory Visit | Attending: Orthopedic Surgery | Admitting: Orthopedic Surgery

## 2021-07-09 DIAGNOSIS — M1711 Unilateral primary osteoarthritis, right knee: Secondary | ICD-10-CM | POA: Insufficient documentation

## 2021-09-27 ENCOUNTER — Other Ambulatory Visit: Payer: Self-pay | Admitting: Obstetrics and Gynecology

## 2021-09-27 DIAGNOSIS — Z1231 Encounter for screening mammogram for malignant neoplasm of breast: Secondary | ICD-10-CM

## 2022-02-15 ENCOUNTER — Ambulatory Visit: Payer: Self-pay | Admitting: Obstetrics and Gynecology

## 2022-02-18 DIAGNOSIS — Z780 Asymptomatic menopausal state: Secondary | ICD-10-CM | POA: Insufficient documentation

## 2022-02-18 NOTE — Progress Notes (Unsigned)
PCP: Kirk Ruths, MD   No chief complaint on file.   HPI:      [name] is a 72 y.o. G2P1011 whose LMP was No LMP recorded. Patient has had a hysterectomy., presents today for her Medicare annual examination.  Her menses are absent due to TAH BSO, no PMB She {does:18564} have vasomotor sx.   Sex activity: {sex active: 315163}. She {does:18564} have vaginal dryness.  Last Pap: 02/12/20 Results were: no abnormalities  Hx of STDs: {STD hx:14358}  Last mammogram: 03/09/21 Results were: normal--routine follow-up in 12 months; has appt 1/24 There is a FH of breast cancer in her mat cousin; genetic testing not indicated for pt. There is no FH of ovarian cancer. The patient {does:18564} do self-breast exams.  Colonoscopy: 6/16 and 6/21 at [facility],  Repeat due after 10*** years.  DEXA: 02/28/17 at [facility]; osteopenia in spine and hip  Tobacco use: {tob:20664} Alcohol use: {Alcohol:11675} No drug use Exercise: {exercise:31265}  She {does:18564} get adequate calcium and Vitamin D in her diet.  Labs with PCP.   Patient Active Problem List   Diagnosis Date Noted   Annual physical exam 02/02/2017    Past Surgical History:  Procedure Laterality Date   ABDOMINAL HYSTERECTOMY     BREAST BIOPSY Right    neg   COLONOSCOPY WITH PROPOFOL N/A 07/31/2014   Procedure: COLONOSCOPY WITH PROPOFOL;  Surgeon: Manya Silvas, MD;  Location: [facility] ENDOSCOPY;  Service: Endoscopy;  Laterality: N/A;   COLONOSCOPY WITH PROPOFOL N/A 07/24/2019   Procedure: COLONOSCOPY WITH PROPOFOL;  Surgeon: Toledo, Benay Pike, MD;  Location: [facility] ENDOSCOPY;  Service: Gastroenterology;  Laterality: N/A;   ESOPHAGOGASTRODUODENOSCOPY N/A 07/31/2014   Procedure: ESOPHAGOGASTRODUODENOSCOPY (EGD);  Surgeon: Manya Silvas, MD;  Location: [facility] ENDOSCOPY;  Service: Endoscopy;  Laterality: N/A;   ESOPHAGOGASTRODUODENOSCOPY (EGD) WITH PROPOFOL N/A 07/24/2019   Procedure: ESOPHAGOGASTRODUODENOSCOPY (EGD) WITH  PROPOFOL;  Surgeon: Toledo, Benay Pike, MD;  Location: [facility] ENDOSCOPY;  Service: Gastroenterology;  Laterality: N/A;   GANGLION CYST EXCISION Right    OOPHORECTOMY Bilateral     Family History  Problem Relation Age of Onset   Hypertension Mother    Kidney failure Mother    Diabetes Father    Hypertension Father    Heart attack Father    Heart attack Brother    Breast cancer Cousin 15        2nd time age 60-mat cousin    Social History   Socioeconomic History   Marital status: Married    Spouse name: Not on file   Number of children: Not on file   Years of education: Not on file   Highest education level: Not on file  Occupational History   Not on file  Tobacco Use   Smoking status: Never   Smokeless tobacco: Never  Vaping Use   Vaping Use: Never used  Substance and Sexual Activity   Alcohol use: No   Drug use: No   Sexual activity: Yes  Other Topics Concern   Not on file  Social History Narrative   Not on file   Social Determinants of Health   Financial Resource Strain: Not on file  Food Insecurity: Not on file  Transportation Needs: Not on file  Physical Activity: Not on file  Stress: Not on file  Social Connections: Not on file  Intimate Partner Violence: Not on file     Current Outpatient Medications:    amLODipine (NORVASC) 10 MG tablet, Take 10 mg  by mouth daily., Disp: , Rfl:    famotidine (PEPCID) 40 MG tablet, Take 40 mg by mouth daily., Disp: , Rfl:    hydrochlorothiazide (HYDRODIURIL) 25 MG tablet, Take 25 mg by mouth daily., Disp: , Rfl:    Multiple Vitamins-Minerals (WOMENS 50+ MULTI VITAMIN/MIN PO), Take 1 tablet by mouth daily., Disp: , Rfl:    potassium chloride (K-DUR) 10 MEQ tablet, Take 10 mEq by mouth daily., Disp: , Rfl:      ROS:  Review of Systems BREAST: No symptoms    Objective: There were no vitals taken for this visit.   OBGyn Exam  Results: No results found for this or any previous visit (from the past 24  hour(s)).  Assessment/Plan:  No diagnosis found.   No orders of the defined types were placed in this encounter.           GYN counsel {counseling: 16159}    F/U  No follow-ups on file.  Kensi Karr B. Sabra Sessler, PA-C 02/18/2022 10:06 PM

## 2022-02-21 ENCOUNTER — Ambulatory Visit (INDEPENDENT_AMBULATORY_CARE_PROVIDER_SITE_OTHER): Payer: Medicare Other | Admitting: Obstetrics and Gynecology

## 2022-02-21 ENCOUNTER — Encounter: Payer: Self-pay | Admitting: Obstetrics and Gynecology

## 2022-02-21 VITALS — BP 128/71 | HR 69 | Ht 67.5 in | Wt 228.0 lb

## 2022-02-21 DIAGNOSIS — Z01419 Encounter for gynecological examination (general) (routine) without abnormal findings: Secondary | ICD-10-CM | POA: Diagnosis not present

## 2022-02-21 DIAGNOSIS — Z1231 Encounter for screening mammogram for malignant neoplasm of breast: Secondary | ICD-10-CM

## 2022-02-21 DIAGNOSIS — Z1211 Encounter for screening for malignant neoplasm of colon: Secondary | ICD-10-CM

## 2022-02-21 DIAGNOSIS — Z13820 Encounter for screening for osteoporosis: Secondary | ICD-10-CM

## 2022-02-21 DIAGNOSIS — Z780 Asymptomatic menopausal state: Secondary | ICD-10-CM

## 2022-02-21 NOTE — Patient Instructions (Signed)
I value your feedback and you entrusting [place] with your care. If you get a [place] patient survey, I would appreciate you taking the time to let us know about your experience today. Thank you!  [place] at [facility]: [phone]  **Call for bone density scan appt

## 2022-03-10 ENCOUNTER — Ambulatory Visit
Admission: RE | Admit: 2022-03-10 | Discharge: 2022-03-10 | Disposition: A | Discharge status: Home / Self Care | Payer: Medicare Other | Source: Ambulatory Visit | Attending: Obstetrics and Gynecology | Admitting: Obstetrics and Gynecology

## 2022-03-10 DIAGNOSIS — Z1231 Encounter for screening mammogram for malignant neoplasm of breast: Secondary | ICD-10-CM | POA: Diagnosis not present

## 2022-03-10 DIAGNOSIS — M8580 Other specified disorders of bone density and structure, unspecified site: Secondary | ICD-10-CM

## 2022-03-10 DIAGNOSIS — Z13820 Encounter for screening for osteoporosis: Secondary | ICD-10-CM | POA: Diagnosis present

## 2022-03-10 DIAGNOSIS — Z780 Asymptomatic menopausal state: Secondary | ICD-10-CM | POA: Insufficient documentation

## 2022-08-02 ENCOUNTER — Other Ambulatory Visit: Payer: Self-pay | Admitting: Surgery

## 2022-08-02 ENCOUNTER — Other Ambulatory Visit: Payer: Self-pay

## 2022-08-02 DIAGNOSIS — M7581 Other shoulder lesions, right shoulder: Secondary | ICD-10-CM

## 2022-08-08 ENCOUNTER — Ambulatory Visit
Admission: RE | Admit: 2022-08-08 | Discharge: 2022-08-08 | Disposition: A | Discharge status: Home / Self Care | Payer: Medicare Other | Source: Ambulatory Visit | Attending: Surgery | Admitting: Surgery

## 2022-08-08 DIAGNOSIS — M7581 Other shoulder lesions, right shoulder: Secondary | ICD-10-CM

## 2022-08-29 ENCOUNTER — Encounter: Payer: Self-pay | Admitting: Internal Medicine

## 2022-08-29 ENCOUNTER — Other Ambulatory Visit: Payer: Self-pay | Admitting: Internal Medicine

## 2022-08-29 DIAGNOSIS — N289 Disorder of kidney and ureter, unspecified: Secondary | ICD-10-CM

## 2022-08-30 ENCOUNTER — Ambulatory Visit
Admission: RE | Admit: 2022-08-30 | Discharge: 2022-08-30 | Disposition: A | Discharge status: Home / Self Care | Payer: Medicare Other | Source: Ambulatory Visit | Attending: Internal Medicine | Admitting: Internal Medicine

## 2022-08-30 DIAGNOSIS — N289 Disorder of kidney and ureter, unspecified: Secondary | ICD-10-CM | POA: Diagnosis present

## 2022-12-26 ENCOUNTER — Other Ambulatory Visit: Payer: Self-pay | Admitting: Obstetrics and Gynecology

## 2022-12-26 DIAGNOSIS — Z1231 Encounter for screening mammogram for malignant neoplasm of breast: Secondary | ICD-10-CM

## 2023-02-21 NOTE — Progress Notes (Addendum)
 PCP: Lenon Layman ORN, MD   Chief Complaint  Patient presents with   Gynecologic Exam    No concerns    HPI:      Emily Olson is a 73 y.o. G2P1011 whose LMP was No LMP recorded. Patient has had a hysterectomy., presents today for her Medicare annual examination.  Her menses are absent due to TAH BSO, no PMB. She has occas hot flashes, no night sweats.    Sex activity: single partner, contraception - status post hysterectomy. She does have vaginal dryness, improved with lubricants.  Last Pap: 02/12/20 Results were: no abnormalities   Last mammogram: 03/10/22 Results were: normal--routine follow-up in 12 months; has appt 1/25 There is a FH of breast cancer in her mat cousin; genetic testing not indicated for pt. There is no FH of ovarian cancer. The patient does do self-breast exams.  Colonoscopy: 6/16 and 6/21 at [facility],  Repeat due after 10 years.  DEXA: 1/24, 1/19 at [facility]; osteopenia in hip, normal spine. FRAX: major fx=5.9%, hip=0.9%. No treatment needed. Repeat in 2 yrs.   Tobacco use: The patient denies current or previous tobacco use. Alcohol use: none No drug use Exercise: very active  She does get adequate calcium and Vitamin D in her diet.  Labs with PCP.   Patient Active Problem List   Diagnosis Date Noted   Osteopenia after menopause 02/18/2022   Annual physical exam 02/02/2017    Past Surgical History:  Procedure Laterality Date   ABDOMINAL HYSTERECTOMY     BREAST BIOPSY Right    neg   COLONOSCOPY WITH PROPOFOL  N/A 07/31/2014   Procedure: COLONOSCOPY WITH PROPOFOL ;  Surgeon: Lamar ONEIDA Holmes, MD;  Location: [facility] ENDOSCOPY;  Service: Endoscopy;  Laterality: N/A;   COLONOSCOPY WITH PROPOFOL  N/A 07/24/2019   Procedure: COLONOSCOPY WITH PROPOFOL ;  Surgeon: Toledo, Ladell POUR, MD;  Location: [facility] ENDOSCOPY;  Service: Gastroenterology;  Laterality: N/A;   ESOPHAGOGASTRODUODENOSCOPY N/A 07/31/2014   Procedure: ESOPHAGOGASTRODUODENOSCOPY (EGD);  Surgeon:  Lamar ONEIDA Holmes, MD;  Location: [facility] ENDOSCOPY;  Service: Endoscopy;  Laterality: N/A;   ESOPHAGOGASTRODUODENOSCOPY (EGD) WITH PROPOFOL  N/A 07/24/2019   Procedure: ESOPHAGOGASTRODUODENOSCOPY (EGD) WITH PROPOFOL ;  Surgeon: Toledo, Ladell POUR, MD;  Location: [facility] ENDOSCOPY;  Service: Gastroenterology;  Laterality: N/A;   GANGLION CYST EXCISION Right    OOPHORECTOMY Bilateral     Family History  Problem Relation Age of Onset   Hypertension Mother    Kidney failure Mother    Diabetes Father    Hypertension Father    Heart attack Father    Heart attack Brother    Cancer - Other Brother 3       Testosterone   Breast cancer Cousin 46        2nd time age 59-mat cousin    Social History   Socioeconomic History   Marital status: Married    Spouse name: Not on file   Number of children: Not on file   Years of education: Not on file   Highest education level: Not on file  Occupational History   Not on file  Tobacco Use   Smoking status: Never   Smokeless tobacco: Never  Vaping Use   Vaping status: Never Used  Substance and Sexual Activity   Alcohol use: Yes    Comment: occ   Drug use: No   Sexual activity: Yes    Birth control/protection: Surgical    Comment: Hysterectomy  Other Topics Concern   Not on file  Social History  Narrative   Not on file   Social Drivers of Health   Financial Resource Strain: Not on file  Food Insecurity: Not on file  Transportation Needs: Not on file  Physical Activity: Not on file  Stress: Not on file  Social Connections: Not on file  Intimate Partner Violence: Not on file     Current Outpatient Medications:    amLODipine (NORVASC) 10 MG tablet, Take 10 mg by mouth daily., Disp: , Rfl:    famotidine (PEPCID) 40 MG tablet, Take 40 mg by mouth daily., Disp: , Rfl:    FARXIGA 10 MG TABS tablet, Take 10 mg by mouth daily., Disp: , Rfl:    losartan-hydrochlorothiazide (HYZAAR) 100-25 MG tablet, Take 1 tablet by mouth daily., Disp: , Rfl:     Multiple Vitamins-Minerals (WOMENS 50+ MULTI VITAMIN/MIN PO), Take 1 tablet by mouth daily., Disp: , Rfl:      ROS:  Review of Systems  Constitutional:  Negative for fatigue, fever and unexpected weight change.  Respiratory:  Negative for cough, shortness of breath and wheezing.   Cardiovascular:  Negative for chest pain, palpitations and leg swelling.  Gastrointestinal:  Negative for blood in stool, constipation, diarrhea, nausea and vomiting.  Endocrine: Negative for cold intolerance, heat intolerance and polyuria.  Genitourinary:  Negative for dyspareunia, dysuria, flank pain, frequency, genital sores, hematuria, menstrual problem, pelvic pain, urgency, vaginal bleeding, vaginal discharge and vaginal pain.  Musculoskeletal:  Negative for back pain, joint swelling and myalgias.  Skin:  Negative for rash.  Neurological:  Negative for dizziness, syncope, light-headedness, numbness and headaches.  Hematological:  Negative for adenopathy.  Psychiatric/Behavioral:  Negative for agitation, confusion, sleep disturbance and suicidal ideas. The patient is not nervous/anxious.    BREAST: No symptoms    Objective: BP (!) 145/67   Pulse 66   Ht 5' 7.5 (1.715 m)   Wt 226 lb (102.5 kg)   BMI 34.87 kg/m  BP recheck 132/70  Physical Exam Constitutional:      Appearance: She is well-developed.  Genitourinary:     Vulva normal.     Genitourinary Comments: UTERUS/CX SURG REM     Right Labia: No rash, tenderness or lesions.    Left Labia: No tenderness, lesions or rash.    Vaginal cuff intact.    No vaginal discharge, erythema or tenderness.      Right Adnexa: not tender and no mass present.    Left Adnexa: not tender and no mass present.    Cervix is absent.     Uterus is absent.  Breasts:    Right: No mass, nipple discharge, skin change or tenderness.     Left: No mass, nipple discharge, skin change or tenderness.  Neck:     Thyroid: No thyromegaly.  Cardiovascular:     Rate  and Rhythm: Normal rate and regular rhythm.     Heart sounds: Normal heart sounds. No murmur heard. Pulmonary:     Effort: Pulmonary effort is normal.     Breath sounds: Normal breath sounds.  Abdominal:     Palpations: Abdomen is soft.     Tenderness: There is no abdominal tenderness. There is no guarding.  Musculoskeletal:        General: Normal range of motion.     Cervical back: Normal range of motion.  Neurological:     General: No focal deficit present.     Mental Status: She is alert and oriented to person, place, and time.     Cranial Nerves:  No cranial nerve deficit.  Skin:    General: Skin is warm and dry.  Psychiatric:        Mood and Affect: Mood normal.        Behavior: Behavior normal.        Thought Content: Thought content normal.        Judgment: Judgment normal.  Vitals reviewed.    Assessment/Plan: Encounter for annual routine gynecological examination  Encounter for screening mammogram for malignant neoplasm of breast; pt has mammo appt  Osteopenia after menopause--cont ca/Vit D/exercise. DEXA due 2026.           GYN counsel breast self exam, mammography screening, menopause, adequate intake of calcium and vitamin D, diet and exercise    F/U  Return in about 1 year (around 02/23/2024). To 2 yrs/ prn  Bethsaida Siegenthaler B. Cornelious Diven, PA-C 02/23/2023 10:06 AM

## 2023-02-23 ENCOUNTER — Ambulatory Visit (INDEPENDENT_AMBULATORY_CARE_PROVIDER_SITE_OTHER): Payer: Medicare Other | Admitting: Obstetrics and Gynecology

## 2023-02-23 ENCOUNTER — Encounter: Payer: Self-pay | Admitting: Obstetrics and Gynecology

## 2023-02-23 VITALS — BP 132/70 | HR 68 | Ht 67.5 in | Wt 226.0 lb

## 2023-02-23 DIAGNOSIS — M8580 Other specified disorders of bone density and structure, unspecified site: Secondary | ICD-10-CM

## 2023-02-23 DIAGNOSIS — Z01419 Encounter for gynecological examination (general) (routine) without abnormal findings: Secondary | ICD-10-CM | POA: Diagnosis not present

## 2023-02-23 DIAGNOSIS — Z1231 Encounter for screening mammogram for malignant neoplasm of breast: Secondary | ICD-10-CM

## 2023-02-23 NOTE — Patient Instructions (Signed)
 I value your feedback and you entrusting [place] with your care. If you get a [place] patient survey, I would appreciate you taking the time to let us know about your experience today. Thank you! ? ? ?

## 2023-03-13 ENCOUNTER — Ambulatory Visit
Admission: RE | Admit: 2023-03-13 | Discharge: 2023-03-13 | Disposition: A | Discharge status: Home / Self Care | Payer: Medicare Other | Source: Ambulatory Visit | Attending: Obstetrics and Gynecology | Admitting: Obstetrics and Gynecology

## 2023-03-13 DIAGNOSIS — Z1231 Encounter for screening mammogram for malignant neoplasm of breast: Secondary | ICD-10-CM | POA: Diagnosis present

## 2023-03-14 ENCOUNTER — Encounter: Payer: Self-pay | Admitting: Obstetrics and Gynecology

## 2023-05-23 ENCOUNTER — Ambulatory Visit
Admission: EM | Admit: 2023-05-23 | Discharge: 2023-05-23 | Disposition: A | Discharge status: Home / Self Care | Attending: Emergency Medicine | Admitting: Emergency Medicine

## 2023-05-23 DIAGNOSIS — M7912 Myalgia of auxiliary muscles, head and neck: Secondary | ICD-10-CM | POA: Diagnosis not present

## 2023-05-23 MED ORDER — TIZANIDINE HCL 4 MG PO TABS: 4.0000 mg | ORAL_TABLET | Freq: Three times a day (TID) | ORAL | 0 refills | Status: DC | PRN

## 2023-05-23 MED ORDER — PREDNISONE 20 MG PO TABS: 40.0000 mg | ORAL_TABLET | Freq: Every day | ORAL | 0 refills | Status: AC

## 2023-05-23 NOTE — ED Provider Notes (Signed)
 HPI  SUBJECTIVE:  Emily Olson is a 73 y.o. female who presents with right anterior neck pain described as constant, sharp achiness, soreness for the past 5 days.  She reports right ear pain yesterday, but this has resolved.  She also reports pain with opening her mouth and a headache.  Notes significant limitation of motion of her neck.  No fevers, trauma to the neck, change in physical activity, neck erythema, swelling, rash, sore throat.  She has tried hot and cold compresses, PT exercises, 200 mg of ibuprofen twice a day, 1 Aleve, Tylenol sinus and a hot shower.  Hot shower helps.  Symptoms are worse with trying to turn her head, lying down, opening her mouth and chewing. She has a past medical history of hypertension, GERD, chronic kidney disease stage I-II.  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  She states that this is usually treated with Flexeril.  [facility]  Past Medical History:  Diagnosis Date   Abnormal Pap smear of cervix    GERD (gastroesophageal reflux disease)    Hypertension    Solitary cyst of breast     Past Surgical History:  Procedure Laterality Date   ABDOMINAL HYSTERECTOMY     BREAST BIOPSY Right    neg   COLONOSCOPY WITH PROPOFOL N/A 07/31/2014   Procedure: COLONOSCOPY WITH PROPOFOL;  Surgeon: Scot Jun, MD;  Location: [facility] ENDOSCOPY;  Service: Endoscopy;  Laterality: N/A;   COLONOSCOPY WITH PROPOFOL N/A 07/24/2019   Procedure: COLONOSCOPY WITH PROPOFOL;  Surgeon: Toledo, Boykin Nearing, MD;  Location: [facility] ENDOSCOPY;  Service: Gastroenterology;  Laterality: N/A;   ESOPHAGOGASTRODUODENOSCOPY N/A 07/31/2014   Procedure: ESOPHAGOGASTRODUODENOSCOPY (EGD);  Surgeon: Scot Jun, MD;  Location: [facility] ENDOSCOPY;  Service: Endoscopy;  Laterality: N/A;   ESOPHAGOGASTRODUODENOSCOPY (EGD) WITH PROPOFOL N/A 07/24/2019   Procedure: ESOPHAGOGASTRODUODENOSCOPY (EGD) WITH PROPOFOL;  Surgeon: Toledo, Boykin Nearing,  MD;  Location: [facility] ENDOSCOPY;  Service: Gastroenterology;  Laterality: N/A;   GANGLION CYST EXCISION Right    OOPHORECTOMY Bilateral     Family History  Problem Relation Age of Onset   Hypertension Mother    Kidney failure Mother    Diabetes Father    Hypertension Father    Heart attack Father    Heart attack Brother    Cancer - Other Brother 33       Testosterone   Breast cancer Cousin 74        2nd time age 3-mat cousin    Social History   Tobacco Use   Smoking status: Never   Smokeless tobacco: Never  Vaping Use   Vaping status: Never Used  Substance Use Topics   Alcohol use: Yes    Comment: occ   Drug use: No    No current facility-administered medications for this encounter.  Current Outpatient Medications:    amLODipine (NORVASC) 10 MG tablet, Take 10 mg by mouth daily., Disp: , Rfl:    famotidine (PEPCID) 40 MG tablet, Take 40 mg by mouth daily., Disp: , Rfl:    FARXIGA 10 MG TABS tablet, Take 10 mg by mouth daily., Disp: , Rfl:    losartan-hydrochlorothiazide (HYZAAR) 100-25 MG tablet, Take 1 tablet by mouth daily., Disp: , Rfl:    Multiple Vitamins-Minerals (WOMENS 50+ MULTI VITAMIN/MIN PO), Take 1 tablet by mouth daily., Disp: , Rfl:    predniSONE (DELTASONE) 20 MG tablet, Take 2 tablets (40 mg total) by mouth daily with breakfast for 5 days.,  Disp: 10 tablet, Rfl: 0   tiZANidine (ZANAFLEX) 4 MG tablet, Take 1 tablet (4 mg total) by mouth every 8 (eight) hours as needed for muscle spasms., Disp: 30 tablet, Rfl: 0  Allergies  Allergen Reactions   Spironolactone Other (See Comments)   Codeine Other (See Comments)   Ketoconazole Palpitations   Penicillins Rash     ROS  As noted in HPI.   Physical Exam  BP (!) 145/73 (BP Location: Left Arm)   Pulse 67   Temp 98.6 F (37 C) (Oral)   Resp 16   Ht 5\' 7"  (1.702 m)   Wt 99.8 kg   SpO2 97%   BMI 34.46 kg/m   Constitutional: Well developed, well nourished, no acute distress.  Eyes: PERRL, EOMI,  conjunctiva normal bilaterally HENT: Normocephalic, atraumatic,mucus membranes moist. Right ear: External ear, EAC, TM normal.  No pain with traction on pinna, palpation of tragus or mastoid.  TMJ nontender, no crepitus. Normal dentition No trapezial tenderness, spasm.  Right-sided sternocleidomastoid spasm, tenderness. Limited range of motion of the neck.  No meningismus. Lymph: No cervical lymphadenopathy Respiratory: Normal inspiratory effort Cardiovascular: No carotid bruit.  normal rate and rhythm, no murmurs, no gallops, no rubs GI: nondistended skin: No rash, skin intact Musculoskeletal: no deformities Neurologic: Alert & oriented x 3, CN III-XII grossly intact, no motor deficits, sensation grossly intact Psychiatric: Speech and behavior appropriate   ED Course   Medications - No data to display  No orders of the defined types were placed in this encounter.  No results found for this or any previous visit (from the past 24 hours). No results found.  ED Clinical Impression  1. Sternocleidomastoid muscle tenderness      ED Assessment/Plan     Calculated creatinine clearance on labs done in December 2024 74 mL/min.  Presentation consistent with sternocleidomastoid spasm.  Doubt infection.  She has had this multiple times in the past that has responded to muscle relaxants.  Offered to check kidney function, but patient declined.  Will send home with Zanaflex, prednisone 40 mg for 5 days.  She may take 200 to 400 mg of ibuprofen once or twice a day combined with 1000 mg of Tylenol.  May take an additional 1000 mg of Tylenol 2-3 more times a day.  PT, heat, massage.  Follow-up with PCP as necessary.  Discussed  MDM, treatment plan, and plan for follow-up with patient\patient agrees with plan.   Meds ordered this encounter  Medications   tiZANidine (ZANAFLEX) 4 MG tablet    Sig: Take 1 tablet (4 mg total) by mouth every 8 (eight) hours as needed for muscle spasms.     Dispense:  30 tablet    Refill:  0   predniSONE (DELTASONE) 20 MG tablet    Sig: Take 2 tablets (40 mg total) by mouth daily with breakfast for 5 days.    Dispense:  10 tablet    Refill:  0      *This clinic note was created using Scientist, clinical (histocompatibility and immunogenetics). Therefore, there may be occasional mistakes despite careful proofreading. ?    Domenick Gong, MD 05/23/23 1255

## 2023-05-23 NOTE — Discharge Instructions (Signed)
 Take 200-400 mg of ibuprofen once or twice a day combined with 1000 mg of Tylenol.  You may take an additional 1000 mg of Tylenol 1-2 more times per day.  Do not exceed 4000 mg of Tylenol from all sources in 24 hours.  Prednisone and Zanaflex will help with muscle spasm, swelling.  Stretching, massage, heat can be helpful.  Please follow-up with your primary care provider for referral to PT.

## 2023-05-23 NOTE — ED Triage Notes (Signed)
 Pt c/o neck pain,R ear pain & HA x5 days. States having limited ROM of neck. Has had similar issues in the past & was given flexeril w/relief.

## 2023-11-03 ENCOUNTER — Other Ambulatory Visit: Payer: Self-pay | Admitting: Obstetrics and Gynecology

## 2023-11-03 DIAGNOSIS — Z1231 Encounter for screening mammogram for malignant neoplasm of breast: Secondary | ICD-10-CM

## 2023-11-13 ENCOUNTER — Ambulatory Visit (INDEPENDENT_AMBULATORY_CARE_PROVIDER_SITE_OTHER)

## 2023-11-13 ENCOUNTER — Ambulatory Visit
Admission: EM | Admit: 2023-11-13 | Discharge: 2023-11-13 | Disposition: A | Discharge status: Home / Self Care | Attending: Family Medicine | Admitting: Family Medicine

## 2023-11-13 DIAGNOSIS — M25511 Pain in right shoulder: Secondary | ICD-10-CM | POA: Insufficient documentation

## 2023-11-13 DIAGNOSIS — M25531 Pain in right wrist: Secondary | ICD-10-CM | POA: Insufficient documentation

## 2023-11-13 DIAGNOSIS — M19041 Primary osteoarthritis, right hand: Secondary | ICD-10-CM | POA: Insufficient documentation

## 2023-11-13 DIAGNOSIS — M79642 Pain in left hand: Secondary | ICD-10-CM | POA: Insufficient documentation

## 2023-11-13 DIAGNOSIS — M25512 Pain in left shoulder: Secondary | ICD-10-CM | POA: Diagnosis present

## 2023-11-13 DIAGNOSIS — M19031 Primary osteoarthritis, right wrist: Secondary | ICD-10-CM | POA: Diagnosis not present

## 2023-11-13 DIAGNOSIS — M79641 Pain in right hand: Secondary | ICD-10-CM | POA: Insufficient documentation

## 2023-11-13 DIAGNOSIS — M25532 Pain in left wrist: Secondary | ICD-10-CM | POA: Insufficient documentation

## 2023-11-13 DIAGNOSIS — R030 Elevated blood-pressure reading, without diagnosis of hypertension: Secondary | ICD-10-CM | POA: Insufficient documentation

## 2023-11-13 LAB — CBC WITH DIFFERENTIAL/PLATELET
Abs Immature Granulocytes: 0.01 K/uL (ref 0.00–0.07)
Basophils Absolute: 0.1 K/uL (ref 0.0–0.1)
Basophils Relative: 1 %
Eosinophils Absolute: 0.1 K/uL (ref 0.0–0.5)
Eosinophils Relative: 2 %
HCT: 41.5 % (ref 36.0–46.0)
Hemoglobin: 13.9 g/dL (ref 12.0–15.0)
Immature Granulocytes: 0 %
Lymphocytes Relative: 21 %
Lymphs Abs: 0.9 K/uL (ref 0.7–4.0)
MCH: 28.8 pg (ref 26.0–34.0)
MCHC: 33.5 g/dL (ref 30.0–36.0)
MCV: 85.9 fL (ref 80.0–100.0)
Monocytes Absolute: 0.3 K/uL (ref 0.1–1.0)
Monocytes Relative: 7 %
Neutro Abs: 2.9 K/uL (ref 1.7–7.7)
Neutrophils Relative %: 69 %
Platelets: 240 K/uL (ref 150–400)
RBC: 4.83 MIL/uL (ref 3.87–5.11)
RDW: 13.2 % (ref 11.5–15.5)
WBC: 4.3 K/uL (ref 4.0–10.5)
nRBC: 0 % (ref 0.0–0.2)

## 2023-11-13 LAB — BASIC METABOLIC PANEL WITH GFR
Anion gap: 9 (ref 5–15)
BUN: 13 mg/dL (ref 8–23)
CO2: 26 mmol/L (ref 22–32)
Calcium: 9.8 mg/dL (ref 8.9–10.3)
Chloride: 100 mmol/L (ref 98–111)
Creatinine, Ser: 0.98 mg/dL (ref 0.44–1.00)
GFR, Estimated: >60 mL/min (ref >60)
Glucose, Bld: 92 mg/dL (ref 70–99)
Potassium: 3.5 mmol/L (ref 3.5–5.1)
Sodium: 135 mmol/L (ref 135–145)

## 2023-11-13 LAB — URIC ACID: Uric Acid, Serum: 4.3 mg/dL (ref 2.5–7.1)

## 2023-11-13 LAB — C-REACTIVE PROTEIN: CRP: 2.9 mg/dL — ABNORMAL HIGH (ref <1.0)

## 2023-11-13 LAB — SEDIMENTATION RATE: Sed Rate: 41 mm/h — ABNORMAL HIGH (ref 0–22)

## 2023-11-13 MED ORDER — TRAMADOL HCL 50 MG PO TABS: 50.0000 mg | ORAL_TABLET | Freq: Four times a day (QID) | ORAL | 0 refills | Status: AC | PRN

## 2023-11-13 MED ORDER — PREDNISONE 10 MG (21) PO TBPK: ORAL_TABLET | Freq: Every day | ORAL | 0 refills | Status: DC

## 2023-11-13 NOTE — Discharge Instructions (Addendum)
 Your uric acid level is low indicating likely not gout.  Your kidney function and electrolytes were normal.  Your white blood cell count which is a marker for infection or inflammation is normal.   We still have some test that are pending that are related to rheumatologic joint pain (ANA, CRP and ESR).  If these are clinically abnormal, someone will call you. Follow-up with your rheumatologist or primary care provider.  Your x-ray showed no significant right hand or wrist arthritis.

## 2023-11-13 NOTE — ED Provider Notes (Signed)
 [facility]    CSN: [id number] Arrival date & time: 11/13/23  1037      History   Chief Complaint Chief Complaint  Patient presents with   Medication Reaction    HPI  HPI Emily Olson is a 73 y.o. female.   Emily Olson presents for concern for an allergic reaction.  Has been having bilateral shoulder pain and given cortisol injections on 10/06/23.  Three days later she started having bilateral hand swelling.  Her PCP wanted to see her immediately. She was seen and given a taper of prednisone  which helped.  She was referred to a rheumatologist but has not seen them.  She is concerned that she may be having an infection.  She is having bilateral hand pain. She has not been able to sleep.  She is not able to wear rings. Has trouble opening the door.   She has seasonal allergies.  She took some Benadryl to help her sleep.     Past Medical History:  Diagnosis Date   Abnormal Pap smear of cervix    GERD (gastroesophageal reflux disease)    Hypertension    Solitary cyst of breast     Patient Active Problem List   Diagnosis Date Noted   Osteopenia after menopause 02/18/2022   Annual physical exam 02/02/2017    Past Surgical History:  Procedure Laterality Date   ABDOMINAL HYSTERECTOMY     BREAST BIOPSY Right    neg   COLONOSCOPY WITH PROPOFOL  N/A 07/31/2014   Procedure: COLONOSCOPY WITH PROPOFOL ;  Surgeon: Lamar ONEIDA Holmes, MD;  Location: [facility] ENDOSCOPY;  Service: Endoscopy;  Laterality: N/A;   COLONOSCOPY WITH PROPOFOL  N/A 07/24/2019   Procedure: COLONOSCOPY WITH PROPOFOL ;  Surgeon: Toledo, Ladell POUR, MD;  Location: [facility] ENDOSCOPY;  Service: Gastroenterology;  Laterality: N/A;   ESOPHAGOGASTRODUODENOSCOPY N/A 07/31/2014   Procedure: ESOPHAGOGASTRODUODENOSCOPY (EGD);  Surgeon: Lamar ONEIDA Holmes, MD;  Location: [facility] ENDOSCOPY;  Service: Endoscopy;  Laterality: N/A;   ESOPHAGOGASTRODUODENOSCOPY (EGD) WITH PROPOFOL  N/A 07/24/2019   Procedure: ESOPHAGOGASTRODUODENOSCOPY  (EGD) WITH PROPOFOL ;  Surgeon: Toledo, Ladell POUR, MD;  Location: [facility] ENDOSCOPY;  Service: Gastroenterology;  Laterality: N/A;   GANGLION CYST EXCISION Right    OOPHORECTOMY Bilateral     OB History     Gravida  2   Para  1   Term  1   Preterm      AB  1   Living  1      SAB  1   IAB      Ectopic      Multiple      Live Births  1            Home Medications    Prior to Admission medications   Medication Sig Start Date End Date Taking? Authorizing Provider  predniSONE  (STERAPRED UNI-PAK 21 TAB) 10 MG (21) TBPK tablet Take by mouth daily. Take 6 tabs by mouth daily for 1, then 5 tabs for 1 day, then 4 tabs for 1 day, then 3 tabs for 1 day, then 2 tabs for 1 day, then 1 tab for 1 day. 11/13/23  Yes [place] Searles, DO  traMADol (ULTRAM) 50 MG tablet Take 1 tablet (50 mg total) by mouth every 6 (six) hours as needed. 11/13/23  Yes Bonnie Roig, DO  amLODipine (NORVASC) 10 MG tablet Take 10 mg by mouth daily.    [provider]  famotidine (PEPCID) 40 MG tablet Take 40 mg by mouth daily.    [provider]  FARXIGA 10 MG TABS tablet Take 10 mg by mouth daily.    [provider]  losartan-hydrochlorothiazide (HYZAAR) 100-25 MG tablet Take 1 tablet by mouth daily.    [provider]  Multiple Vitamins-Minerals (WOMENS 50+ MULTI VITAMIN/MIN PO) Take 1 tablet by mouth daily.    [provider]  tiZANidine  (ZANAFLEX ) 4 MG tablet Take 1 tablet (4 mg total) by mouth every 8 (eight) hours as needed for muscle spasms. 05/23/23   Van Knee, MD    Family History Family History  Problem Relation Age of Onset   Hypertension Mother    Kidney failure Mother    Diabetes Father    Hypertension Father    Heart attack Father    Heart attack Brother    Cancer - Other Brother 101       Testosterone   Breast cancer Cousin 19        2nd time age 24-mat cousin    Social History Social History   Tobacco Use   Smoking status:  Never   Smokeless tobacco: Never  Vaping Use   Vaping status: Never Used  Substance Use Topics   Alcohol use: Yes    Comment: occ   Drug use: No     Allergies   Codeine, Ketoconazole, Penicillin g, Penicillins, and Spironolactone   Review of Systems Review of Systems: :negative unless otherwise stated in HPI.      Physical Exam Triage Vital Signs ED Triage Vitals  Encounter Vitals Group     BP 11/13/23 1110 (!) 167/82     Girls Systolic BP Percentile --      Girls Diastolic BP Percentile --      Boys Systolic BP Percentile --      Boys Diastolic BP Percentile --      Pulse Rate 11/13/23 1110 66     Resp 11/13/23 1110 16     Temp 11/13/23 1110 98.1 F (36.7 C)     Temp Source 11/13/23 1110 Oral     SpO2 11/13/23 1110 99 %     Weight 11/13/23 1109 225 lb 3.2 oz (102.2 kg)     Height --      Head Circumference --      Peak Flow --      Pain Score 11/13/23 1115 10     Pain Loc --      Pain Education --      Exclude from Growth Chart --    No data found.  Updated Vital Signs BP (!) 167/82 (BP Location: Left Wrist)   Pulse 66   Temp 98.1 F (36.7 C) (Oral)   Resp 16   Wt 102.2 kg   SpO2 99%   BMI 35.27 kg/m   Visual Acuity Right Eye Distance:   Left Eye Distance:   Bilateral Distance:    Right Eye Near:   Left Eye Near:    Bilateral Near:     Physical Exam GEN: well appearing female in no acute distress  CVS: well perfused  RESP: speaking in full sentences without pause, no respiratory distress  MSK:   Bilateral Hand: Inspection: No obvious deformity b/l. No swelling, erythema or bruising b/l Palpation: bilateral phalangeal  TTP but no TTP over the metacarpals  ROM: Full ROM of the digits and wrist b/l. + swelling at the PIP and some of the DIP joints b/l. Flexor digitorum profundus and superficialis tendon functions are intact.  PIP joint collateral ligaments are stable  Strength:  5/5 strength in the forearm, wrist and interosseus muscles  b/l Neurovascular: NV intact b/l Wrist, bilateral: Inspection yielded no erythema, ecchymosis, bony deformity, or swelling. ROM full with good flexion and extension and ulnar/radial deviation that is symmetrical with opposite wrist. Palpation is normal over metacarpals, scaphoid; tendons without tenderness/swelling. Strength 5/5 in all directions without pain.     UC Treatments / Results  Labs (all labs ordered are listed, but only abnormal results are displayed) Labs Reviewed  SEDIMENTATION RATE - Abnormal; Notable for the following components:      Result Value   Sed Rate 41 (*)    All other components within normal limits  CBC WITH DIFFERENTIAL/PLATELET  BASIC METABOLIC PANEL WITH GFR  URIC ACID  ANA  C-REACTIVE PROTEIN    EKG   Radiology DG Wrist Complete Right Result Date: 11/13/2023 CLINICAL DATA:  joint swelling, pain EXAM: RIGHT WRIST - COMPLETE 3+ VIEW; RIGHT HAND - COMPLETE 3+ VIEW COMPARISON:  None Available. FINDINGS: No acute fracture or dislocation. No aggressive osseous lesion. No significant arthritis of imaged joints. No radiopaque foreign bodies. Soft tissues are within normal limits. IMPRESSION: No acute osseous abnormality of the right hand and wrist. Electronically Signed   By: Ree Molt M.D.   On: 11/13/2023 13:21   DG Hand Complete Right Result Date: 11/13/2023 CLINICAL DATA:  joint swelling, pain EXAM: RIGHT WRIST - COMPLETE 3+ VIEW; RIGHT HAND - COMPLETE 3+ VIEW COMPARISON:  None Available. FINDINGS: No acute fracture or dislocation. No aggressive osseous lesion. No significant arthritis of imaged joints. No radiopaque foreign bodies. Soft tissues are within normal limits. IMPRESSION: No acute osseous abnormality of the right hand and wrist. Electronically Signed   By: Ree Molt M.D.   On: 11/13/2023 13:21     Procedures Procedures (including critical care time)  Medications Ordered in UC Medications - No data to display  Initial Impression /  Assessment and Plan / UC Course  I have reviewed the triage vital signs and the nursing notes.  Pertinent labs & imaging results that were available during my care of the patient were reviewed by me and considered in my medical decision making (see chart for details).      Pt is a 74 y.o.  female with 3-4 weeks of bilateral UE pain. She received bilateral cortisol injection for her shoulder pain 3 days prior to her symptoms started. She was seen by orthopedics and her PCP. She was referred to rheumatology at [facility].   Patient worried about infection therefore CBC obtained.  CBC is normal.  States that she is concerned about her kidney function as she has been taking some ibuprofen therefore BMP obtained.  BMP is also normal.  Uric acid obtained to assess for possible gout which was normal.  ANA, CRP and ESR although obtained to assess for possible rheumatologic inflammatory arthritis.  These test are pending.  Given her bilateral wrist and hand pain a x-ray to assess for tophi versus rheumatologic nodules was obtained for the right. Personally interpreted by me were unremarkable for fracture or dislocation. Radiologist report reviewed and additionally notes no significant arthritis  Patient to gradually return to normal activities, as tolerated and continue ordinary activities within the limits permitted by pain. Prescribed prednisone  and short course of tramadol for pain relief.  Tylenol PRN.   Patient to follow up with orthopedic provider, PCP or rheumatologist, if symptoms do not improve with conservative treatment.  Return and ED precautions given. Understanding voiced. Discussed MDM,  treatment plan and plan for follow-up with patient who agrees with plan.   Final Clinical Impressions(s) / UC Diagnoses   Final diagnoses:  Joint inflammation of right hand and wrist  Elevated blood pressure reading  Bilateral hand pain  Bilateral wrist pain  Acute pain of both shoulders      Discharge Instructions      Your uric acid level is low indicating likely not gout.  Your kidney function and electrolytes were normal.  Your white blood cell count which is a marker for infection or inflammation is normal.   We still have some test that are pending that are related to rheumatologic joint pain (ANA, CRP and ESR).  If these are clinically abnormal, someone will call you. Follow-up with your rheumatologist or primary care provider.  Your x-ray showed no significant right hand or wrist arthritis.     ED Prescriptions     Medication Sig Dispense Auth. Provider   traMADol (ULTRAM) 50 MG tablet Take 1 tablet (50 mg total) by mouth every 6 (six) hours as needed. 10 tablet Emily Michna, DO   predniSONE  (STERAPRED UNI-PAK 21 TAB) 10 MG (21) TBPK tablet Take by mouth daily. Take 6 tabs by mouth daily for 1, then 5 tabs for 1 day, then 4 tabs for 1 day, then 3 tabs for 1 day, then 2 tabs for 1 day, then 1 tab for 1 day. 21 tablet Emily Nasca, DO      I have reviewed the PDMP during this encounter.   Emily Gondek, DO 11/13/23 1608

## 2023-11-13 NOTE — ED Triage Notes (Signed)
 Pt c/o bilateral hand pain & edema x1 mon. States was seen by ortho on 8/22 for the same issue. Given 2 cortisol shots in shoulders d/t acute pain. Was seen on PCP on 9/4 & given PRED taper w/minor relief.

## 2023-11-14 ENCOUNTER — Ambulatory Visit (HOSPITAL_COMMUNITY): Payer: Self-pay

## 2023-11-14 LAB — ANA: Anti Nuclear Antibody (ANA): NEGATIVE

## 2023-11-15 NOTE — Telephone Encounter (Signed)
 Called and updated patient with her results.  She has an appointment scheduled with a rheumatologist next week. The prednisone  is helping.     Caprice Porteous, DO

## 2024-02-03 ENCOUNTER — Encounter: Payer: Self-pay | Admitting: Emergency Medicine

## 2024-02-03 ENCOUNTER — Ambulatory Visit: Admission: EM | Admit: 2024-02-03 | Discharge: 2024-02-03 | Disposition: A | Discharge status: Home / Self Care

## 2024-02-03 DIAGNOSIS — J22 Unspecified acute lower respiratory infection: Secondary | ICD-10-CM

## 2024-02-03 DIAGNOSIS — Z8679 Personal history of other diseases of the circulatory system: Secondary | ICD-10-CM | POA: Diagnosis not present

## 2024-02-03 MED ORDER — DOXYCYCLINE HYCLATE 100 MG PO CAPS: 100.0000 mg | ORAL_CAPSULE | Freq: Two times a day (BID) | ORAL | 0 refills | Status: AC

## 2024-02-03 NOTE — ED Triage Notes (Signed)
 Patient c/o cough and chest congestion that started about a week ago.  Patient denies fevers.

## 2024-02-03 NOTE — ED Provider Notes (Signed)
 " [facility]    CSN: [id number] Arrival date & time: 02/03/24  0800      History   Chief Complaint Chief Complaint  Patient presents with   Cough    HPI Emily Olson is a 73 y.o. female.   Emily Olson, 73 year old female, presents to urgent care for evaluation of cough and congestion that started about a week ago.  Symptoms are worse at night when lays down. Patient denies fevers. No known illness exposures.  PMH: Hypertension, reflux  The history is provided by the patient. No language interpreter was used.  Cough Associated symptoms: no chest pain and no fever     Past Medical History:  Diagnosis Date   Abnormal Pap smear of cervix    GERD (gastroesophageal reflux disease)    Hypertension    Solitary cyst of breast     Patient Active Problem List   Diagnosis Date Noted   History of hypertension 02/03/2024   Acute respiratory infection 02/03/2024   Osteopenia after menopause 02/18/2022   Annual physical exam 02/02/2017    Past Surgical History:  Procedure Laterality Date   ABDOMINAL HYSTERECTOMY     BREAST BIOPSY Right    neg   COLONOSCOPY WITH PROPOFOL  N/A 07/31/2014   Procedure: COLONOSCOPY WITH PROPOFOL ;  Surgeon: Lamar ONEIDA Holmes, MD;  Location: [facility] ENDOSCOPY;  Service: Endoscopy;  Laterality: N/A;   COLONOSCOPY WITH PROPOFOL  N/A 07/24/2019   Procedure: COLONOSCOPY WITH PROPOFOL ;  Surgeon: Toledo, Ladell POUR, MD;  Location: [facility] ENDOSCOPY;  Service: Gastroenterology;  Laterality: N/A;   ESOPHAGOGASTRODUODENOSCOPY N/A 07/31/2014   Procedure: ESOPHAGOGASTRODUODENOSCOPY (EGD);  Surgeon: Lamar ONEIDA Holmes, MD;  Location: [facility] ENDOSCOPY;  Service: Endoscopy;  Laterality: N/A;   ESOPHAGOGASTRODUODENOSCOPY (EGD) WITH PROPOFOL  N/A 07/24/2019   Procedure: ESOPHAGOGASTRODUODENOSCOPY (EGD) WITH PROPOFOL ;  Surgeon: Toledo, Ladell POUR, MD;  Location: [facility] ENDOSCOPY;  Service: Gastroenterology;  Laterality: N/A;   GANGLION CYST EXCISION Right     OOPHORECTOMY Bilateral     OB History     Gravida  2   Para  1   Term  1   Preterm      AB  1   Living  1      SAB  1   IAB      Ectopic      Multiple      Live Births  1            Home Medications    Prior to Admission medications  Medication Sig Start Date End Date Taking? Authorizing Provider  doxycycline  (VIBRAMYCIN ) 100 MG capsule Take 1 capsule (100 mg total) by mouth 2 (two) times daily for 7 days. 02/03/24 02/10/24 Yes Redith Drach, NP  methotrexate (RHEUMATREX) 2.5 MG tablet Take 15 mg by mouth. 01/15/24  Yes [provider]  amLODipine (NORVASC) 10 MG tablet Take 10 mg by mouth daily.    [provider]  famotidine (PEPCID) 40 MG tablet Take 40 mg by mouth daily.    [provider]  FARXIGA 10 MG TABS tablet Take 10 mg by mouth daily.    [provider]  losartan-hydrochlorothiazide (HYZAAR) 100-25 MG tablet Take 1 tablet by mouth daily.    [provider]  Multiple Vitamins-Minerals (WOMENS 50+ MULTI VITAMIN/MIN PO) Take 1 tablet by mouth daily.    [provider]  predniSONE  (STERAPRED UNI-PAK 21 TAB) 10 MG (21) TBPK tablet Take by mouth daily. Take 6 tabs by mouth daily for 1, then 5 tabs for  1 day, then 4 tabs for 1 day, then 3 tabs for 1 day, then 2 tabs for 1 day, then 1 tab for 1 day. 11/13/23   Brimage, Vondra, DO  tiZANidine  (ZANAFLEX ) 4 MG tablet Take 1 tablet (4 mg total) by mouth every 8 (eight) hours as needed for muscle spasms. 05/23/23   Van Knee, MD  traMADol  (ULTRAM ) 50 MG tablet Take 1 tablet (50 mg total) by mouth every 6 (six) hours as needed. 11/13/23   Brimage, Vondra, DO    Family History Family History  Problem Relation Age of Onset   Hypertension Mother    Kidney failure Mother    Diabetes Father    Hypertension Father    Heart attack Father    Heart attack Brother    Cancer - Other Brother 52       Testosterone   Breast cancer Cousin 53        2nd time  age 83-mat cousin    Social History Social History[1]   Allergies   Codeine, Ketoconazole, Penicillin g, Penicillins, and Spironolactone   Review of Systems Review of Systems  Constitutional:  Negative for fever.  HENT:  Positive for congestion.   Respiratory:  Positive for cough.   Cardiovascular:  Negative for chest pain and palpitations.  All other systems reviewed and are negative.    Physical Exam Triage Vital Signs ED Triage Vitals [02/03/24 0814]  Encounter Vitals Group     BP      Girls Systolic BP Percentile      Girls Diastolic BP Percentile      Boys Systolic BP Percentile      Boys Diastolic BP Percentile      Pulse      Resp      Temp      Temp src      SpO2      Weight 225 lb 5 oz (102.2 kg)     Height 5' 7 (1.702 m)     Head Circumference      Peak Flow      Pain Score 0     Pain Loc      Pain Education      Exclude from Growth Chart    No data found.  Updated Vital Signs BP (!) 164/80 (BP Location: Right Arm)   Pulse 69   Temp 98.7 F (37.1 C) (Oral)   Resp 14   Ht 5' 7 (1.702 m)   Wt 225 lb 5 oz (102.2 kg)   SpO2 97%   BMI 35.29 kg/m   Visual Acuity Right Eye Distance:   Left Eye Distance:   Bilateral Distance:    Right Eye Near:   Left Eye Near:    Bilateral Near:     Physical Exam Vitals and nursing note reviewed.  Constitutional:      General: She is not in acute distress.    Appearance: She is well-developed and well-groomed.  HENT:     Head: Normocephalic and atraumatic.     Nose: Mucosal edema and congestion present.     Mouth/Throat:     Lips: Pink.     Mouth: Mucous membranes are moist.     Pharynx: Postnasal drip present.  Eyes:     Conjunctiva/sclera: Conjunctivae normal.  Cardiovascular:     Rate and Rhythm: Normal rate and regular rhythm.     Heart sounds: Normal heart sounds. No murmur heard. Pulmonary:     Effort: Pulmonary effort is normal.  No respiratory distress.     Breath sounds: Normal breath  sounds and air entry.  Abdominal:     Palpations: Abdomen is soft.     Tenderness: There is no abdominal tenderness.  Musculoskeletal:        General: No swelling.     Cervical back: Neck supple.  Skin:    General: Skin is warm and dry.     Capillary Refill: Capillary refill takes less than 2 seconds.  Neurological:     General: No focal deficit present.     Mental Status: She is alert and oriented to person, place, and time.     GCS: GCS eye subscore is 4. GCS verbal subscore is 5. GCS motor subscore is 6.  Psychiatric:        Mood and Affect: Mood normal.        Behavior: Behavior is cooperative.      UC Treatments / Results  Labs (all labs ordered are listed, but only abnormal results are displayed) Labs Reviewed - No data to display  EKG   Radiology No results found.  Procedures Procedures (including critical care time)  Medications Ordered in UC Medications - No data to display  Initial Impression / Assessment and Plan / UC Course  I have reviewed the triage vital signs and the nursing notes.  Pertinent labs & imaging results that were available during my care of the patient were reviewed by me and considered in my medical decision making (see chart for details).    Discussed exam findings and plan of care with patient, doxycycline  scripted. Take home meds as prescribed. strict go to ER precautions given.  Follow up with PCP.  Patient verbalized understanding to this provider.  Ddx: Acute respiratory infection, hx of hypertension,  viral illness, allergies Final Clinical Impressions(s) / UC Diagnoses   Final diagnoses:  History of hypertension  Acute respiratory infection     Discharge Instructions      Take doxycyline as prescribed for acute respiratory infection. Drink plenty of water  Follow up with PCP next week if symptoms persist If you have new or worsening issues, go to Er for further evaluation.      ED Prescriptions     Medication Sig  Dispense Auth. Provider   doxycycline  (VIBRAMYCIN ) 100 MG capsule Take 1 capsule (100 mg total) by mouth 2 (two) times daily for 7 days. 14 capsule Aybree Lanyon, NP      PDMP not reviewed this encounter.     [1]  Social History Tobacco Use   Smoking status: Never   Smokeless tobacco: Never  Vaping Use   Vaping status: Never Used  Substance Use Topics   Alcohol use: Yes    Comment: occ   Drug use: No     Zoi Devine, Rilla, NP 02/03/24 9167  "

## 2024-02-03 NOTE — Discharge Instructions (Signed)
 Take doxycyline as prescribed for acute respiratory infection. Drink plenty of water  Follow up with PCP next week if symptoms persist If you have new or worsening issues, go to Er for further evaluation.

## 2024-02-11 IMAGING — MR MR KNEE*R* W/O CM
7 series · 40 of 40 positions shown · non-contrast
Comparison: None Available.

CLINICAL DATA: Right knee pain and swelling. Anterior and medial
pain since December 2020.

EXAM:
MRI OF THE RIGHT KNEE WITHOUT CONTRAST
TECHNIQUE: Multiplanar, multisequence MR imaging of the right knee was
performed. No intravenous contrast was administered.

[Series 8: T2 fat-sat · axial · right · 4.0mm · 0.50mm/px · z∈[-23,+128]mm · 5 of 32 slices shown (1 of 3)]
[im 1/32]
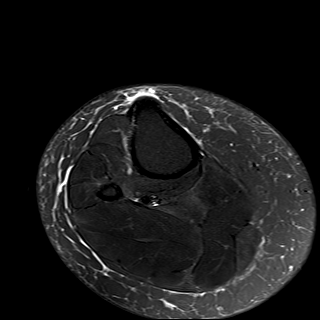
[im 8/32]
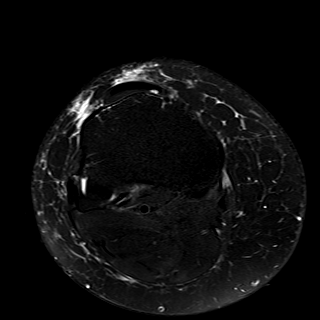
[im 16/32]
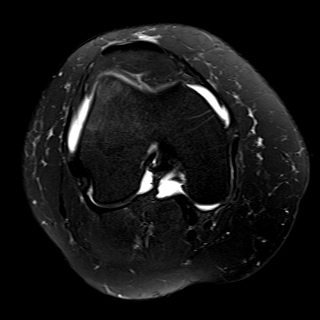
[im 24/32]
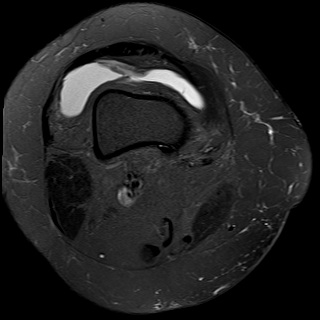
[im 32/32]
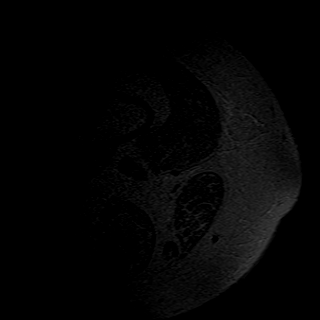

[Series 9: T1 · coronal · right · 4.0mm · 0.47mm/px · 6 of 32 slices shown]
[im 1/32]
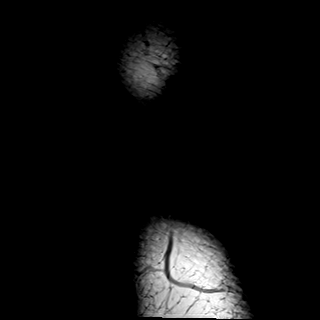
[im 7/32]
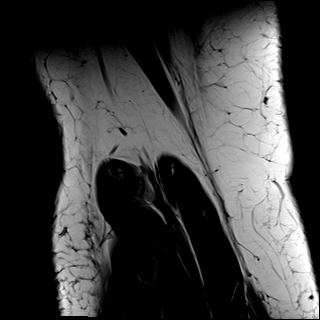
[im 13/32]
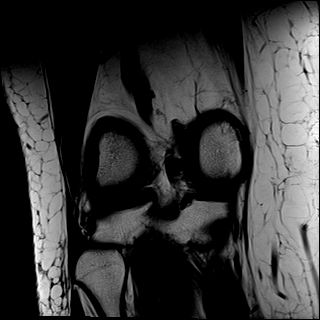
[im 19/32]
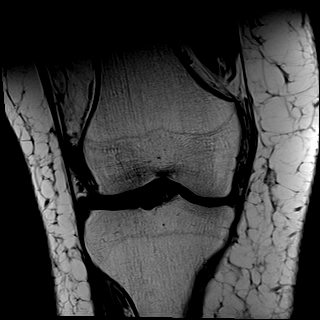
[im 25/32]
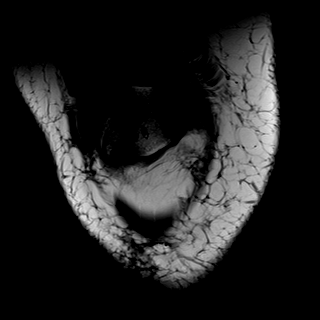
[im 32/32]
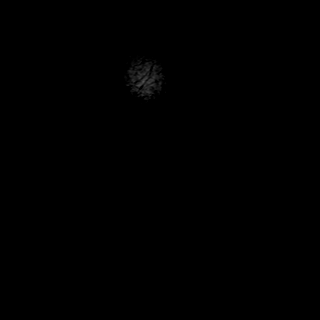

[Series 10: T2 fat-sat · coronal · right · 4.0mm · 0.47mm/px · 6 of 32 slices shown (2 of 3)]
[im 1/32]
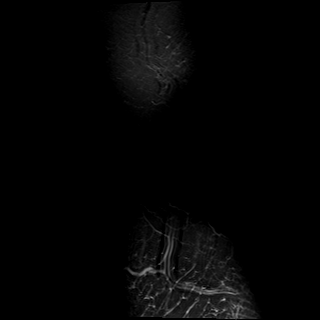
[im 7/32]
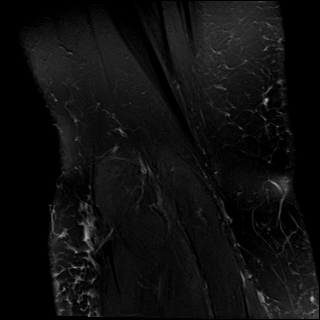
[im 13/32]
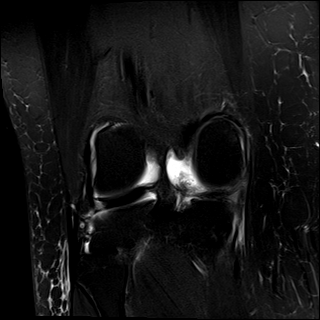
[im 19/32]
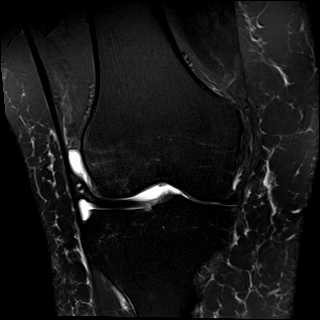
[im 25/32]
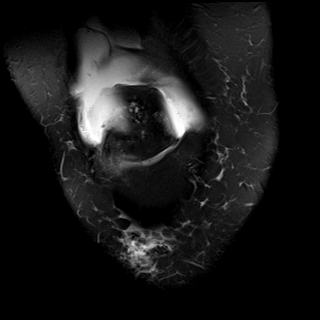
[im 32/32]
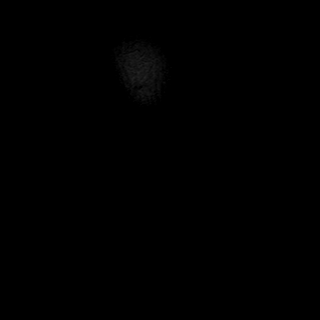

[Series 11: PD fat-sat · coronal · right · 4.0mm · 0.59mm/px · 6 of 32 slices shown (1 of 2)]
[im 1/32]
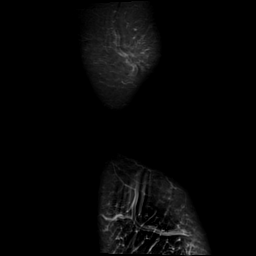
[im 7/32]
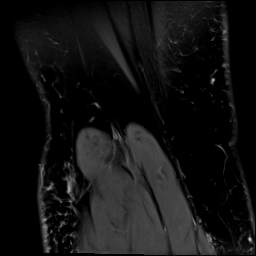
[im 13/32]
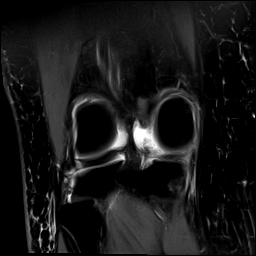
[im 19/32]
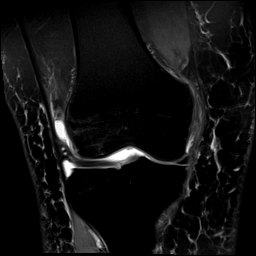
[im 25/32]
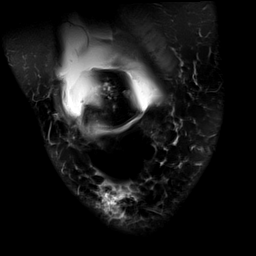
[im 32/32]
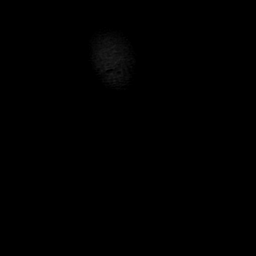

[Series 12: PD fat-sat · sagittal · right · 3.0mm · 0.47mm/px · 7 of 35 slices shown (2 of 2)]
[im 1/35]
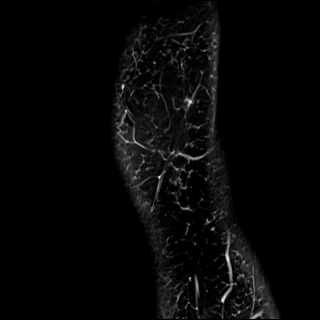
[im 6/35]
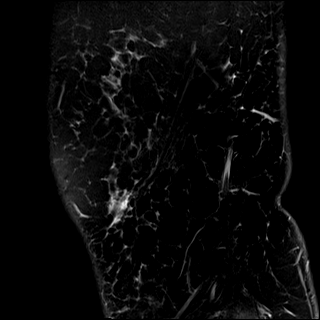
[im 12/35]
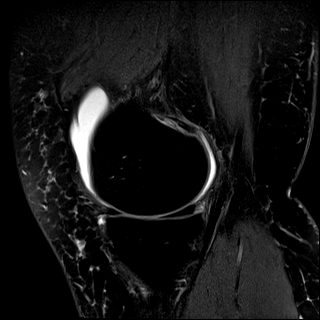
[im 18/35]
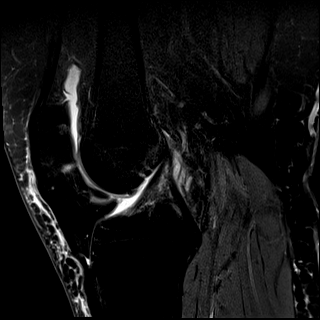
[im 23/35]
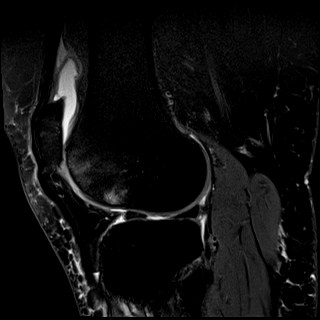
[im 29/35]
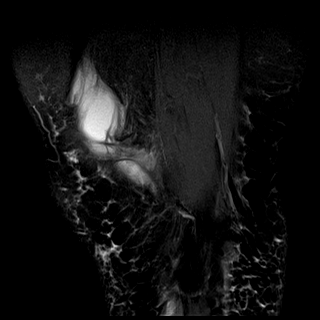
[im 35/35]
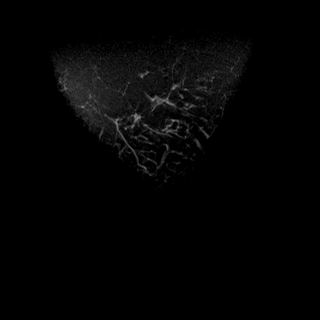

[Series 13: T2 fat-sat · sagittal · right · 3.0mm · 0.47mm/px · 7 of 35 slices shown (3 of 3)]
[im 1/35]
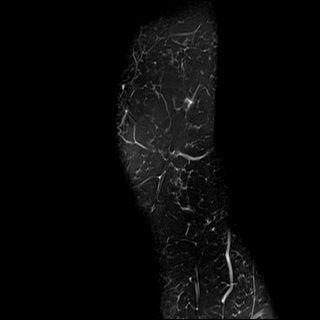
[im 6/35]
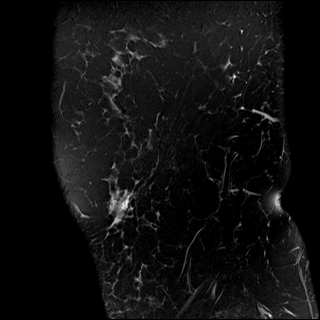
[im 12/35]
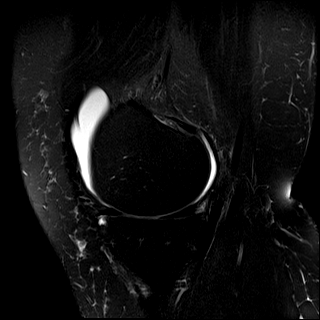
[im 18/35]
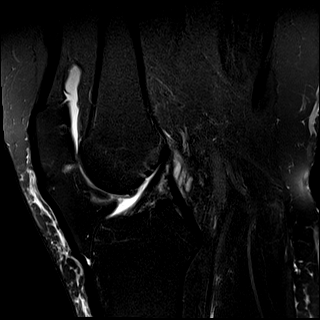
[im 23/35]
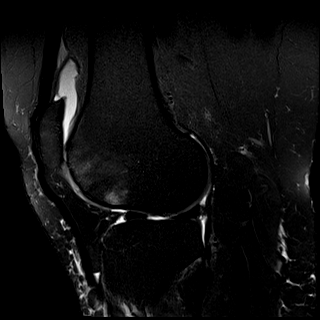
[im 29/35]
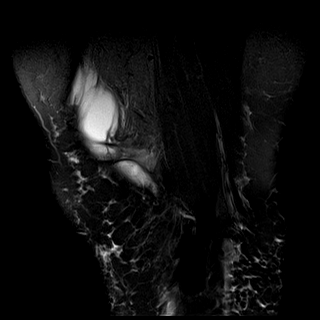
[im 35/35]
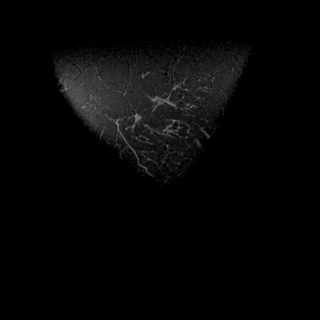

[Series 14: PD · coronal · right · 2.0mm · 0.47mm/px · 3 of 16 slices shown]
[im 1/16]
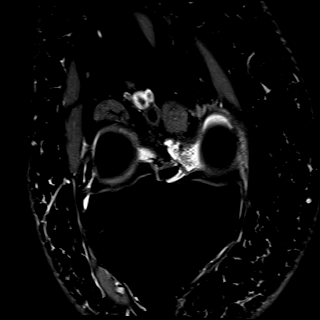
[im 8/16]
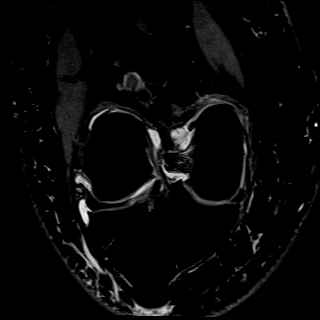
[im 16/16]
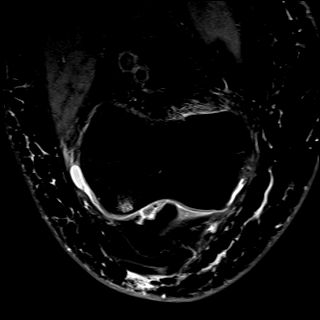

[40 of 40 positions shown; findings below may reference images not displayed]

FINDINGS: MENISCI

Medial: Intact.

Lateral: Intact.

LIGAMENTS

Cruciates: ACL and PCL are intact.

Collaterals: Medial collateral ligament is intact. Lateral
collateral ligament complex is intact.

CARTILAGE

Patellofemoral: Full-thickness cartilage defect with subchondral
cystic changes of the lateral trochlea, patellar apex and lateral
patellar facet.

Medial: Mild partial-thickness cartilage loss of the medial
femorotibial compartment.

Lateral: Focal full-thickness cartilage defect at the weight-bearing
area.

JOINT: Large joint effusion. Normal Klever Jumper. Mild synovial
thickening.

POPLITEAL FOSSA: Popliteus tendon is intact. No Baker's cyst.

EXTENSOR MECHANISM: Intact quadriceps tendon. Mild patellar
tendinopathy. Intact lateral patellar retinaculum. Intact medial
patellar retinaculum. Intact MPFL.

BONES: No aggressive osseous lesion. No fracture or dislocation.

Other: Moderate prepatellar soft tissue edema without evidence of
fluid collection or hematoma. No fluid collection or hematoma.
Muscles are normal.
IMPRESSION: 1. Moderate to severe patellofemoral osteoarthritis with subchondral
cystic changes of the patellar facet and lateral trochlea with small
marginal osteophytes.

2.  Large suprapatellar knee joint effusion with synovitis.

3.  Mild medial tibiofemoral and patellofemoral osteoarthritis.

4.  Mild patellar tendinopathy.

5. Cruciate and collateral ligaments are intact. Quadriceps tendon
is intact.

## 2024-03-13 ENCOUNTER — Ambulatory Visit
Admission: RE | Admit: 2024-03-13 | Discharge: 2024-03-13 | Disposition: A | Discharge status: Home / Self Care | Source: Ambulatory Visit | Attending: Obstetrics and Gynecology | Admitting: Obstetrics and Gynecology

## 2024-03-13 DIAGNOSIS — Z1231 Encounter for screening mammogram for malignant neoplasm of breast: Secondary | ICD-10-CM | POA: Insufficient documentation

## 2024-03-15 ENCOUNTER — Ambulatory Visit: Payer: Self-pay | Admitting: Obstetrics and Gynecology

## 2024-03-21 ENCOUNTER — Encounter: Payer: Self-pay | Admitting: Obstetrics and Gynecology

## 2024-03-21 ENCOUNTER — Ambulatory Visit: Admitting: Obstetrics and Gynecology

## 2024-03-21 VITALS — BP 139/70 | HR 70 | Ht 67.5 in | Wt 226.0 lb

## 2024-03-21 DIAGNOSIS — Z780 Asymptomatic menopausal state: Secondary | ICD-10-CM

## 2024-03-21 DIAGNOSIS — Z1231 Encounter for screening mammogram for malignant neoplasm of breast: Secondary | ICD-10-CM

## 2024-03-21 DIAGNOSIS — Z01419 Encounter for gynecological examination (general) (routine) without abnormal findings: Secondary | ICD-10-CM

## 2024-03-21 NOTE — Patient Instructions (Signed)
 I value your feedback and you entrusting [place] with your care. If you get a [facility] patient survey, I would appreciate you taking the time to let us know about your experience today. Thank you!  [facility] ([place])--[phone]

## 2024-04-18 ENCOUNTER — Ambulatory Visit
